# Patient Record
Sex: Female | Born: 1980 | Race: Black or African American | Hispanic: No | Marital: Single | Smoking: Former smoker
Health system: Southern US, Community
[De-identification: ages and names within clinical notes are randomized; demographics above are authoritative.]

## PROBLEM LIST (undated history)

## (undated) DIAGNOSIS — K5190 Ulcerative colitis, unspecified, without complications: Secondary | ICD-10-CM

## (undated) DIAGNOSIS — D509 Iron deficiency anemia, unspecified: Secondary | ICD-10-CM

## (undated) DIAGNOSIS — K529 Noninfective gastroenteritis and colitis, unspecified: Secondary | ICD-10-CM

## (undated) DIAGNOSIS — Z98891 History of uterine scar from previous surgery: Secondary | ICD-10-CM

## (undated) DIAGNOSIS — R634 Abnormal weight loss: Secondary | ICD-10-CM

## (undated) DIAGNOSIS — Z789 Other specified health status: Secondary | ICD-10-CM

## (undated) DIAGNOSIS — R1030 Lower abdominal pain, unspecified: Secondary | ICD-10-CM

## (undated) HISTORY — DX: Ulcerative colitis, unspecified, without complications: K51.90

## (undated) HISTORY — DX: Noninfective gastroenteritis and colitis, unspecified: K52.9

## (undated) HISTORY — DX: History of uterine scar from previous surgery: Z98.891

## (undated) HISTORY — DX: Lower abdominal pain, unspecified: R10.30

## (undated) HISTORY — DX: Abnormal weight loss: R63.4

## (undated) HISTORY — DX: Iron deficiency anemia, unspecified: D50.9

---

## 2015-01-25 DIAGNOSIS — Z98891 History of uterine scar from previous surgery: Secondary | ICD-10-CM

## 2015-01-25 HISTORY — DX: History of uterine scar from previous surgery: Z98.891

## 2015-06-08 LAB — OB RESULTS CONSOLE VARICELLA ZOSTER ANTIBODY, IGG: VARICELLA IGG: IMMUNE

## 2015-06-08 LAB — OB RESULTS CONSOLE HIV ANTIBODY (ROUTINE TESTING)
HIV: NONREACTIVE
HIV: NONREACTIVE

## 2015-06-08 LAB — OB RESULTS CONSOLE RPR
RPR: NONREACTIVE
RPR: NONREACTIVE

## 2015-06-08 LAB — OB RESULTS CONSOLE HEPATITIS B SURFACE ANTIGEN: Hepatitis B Surface Ag: NEGATIVE

## 2015-06-08 LAB — OB RESULTS CONSOLE ABO/RH: RH TYPE: POSITIVE

## 2015-06-08 LAB — OB RESULTS CONSOLE RUBELLA ANTIBODY, IGM: RUBELLA: NON-IMMUNE/NOT IMMUNE

## 2015-10-15 LAB — OB RESULTS CONSOLE RPR
RPR: NONREACTIVE
RPR: NONREACTIVE
RPR: NONREACTIVE

## 2015-10-15 LAB — OB RESULTS CONSOLE HIV ANTIBODY (ROUTINE TESTING)
HIV: NONREACTIVE
HIV: NONREACTIVE
HIV: NONREACTIVE

## 2015-12-10 LAB — OB RESULTS CONSOLE GBS: GBS: NEGATIVE

## 2016-01-06 ENCOUNTER — Encounter: Payer: Self-pay | Admitting: *Deleted

## 2016-01-06 ENCOUNTER — Inpatient Hospital Stay
Admission: EM | Admit: 2016-01-06 | Discharge: 2016-01-08 | DRG: 765 | Disposition: A | Discharge status: Home / Self Care | Payer: Medicaid Other | Attending: Obstetrics and Gynecology | Admitting: Obstetrics and Gynecology

## 2016-01-06 ENCOUNTER — Inpatient Hospital Stay: Payer: Medicaid Other | Admitting: Certified Registered"

## 2016-01-06 ENCOUNTER — Inpatient Hospital Stay: Payer: Medicaid Other

## 2016-01-06 ENCOUNTER — Encounter
Admission: EM | Disposition: A | Discharge status: Home / Self Care | Payer: Self-pay | Source: Home / Self Care | Attending: Obstetrics and Gynecology

## 2016-01-06 DIAGNOSIS — Z3A40 40 weeks gestation of pregnancy: Secondary | ICD-10-CM | POA: Diagnosis not present

## 2016-01-06 DIAGNOSIS — Z98891 History of uterine scar from previous surgery: Secondary | ICD-10-CM

## 2016-01-06 DIAGNOSIS — Y703 Surgical instruments, materials and anesthesiology devices (including sutures) associated with adverse incidents: Secondary | ICD-10-CM

## 2016-01-06 DIAGNOSIS — D62 Acute posthemorrhagic anemia: Secondary | ICD-10-CM | POA: Diagnosis not present

## 2016-01-06 DIAGNOSIS — O9081 Anemia of the puerperium: Secondary | ICD-10-CM | POA: Diagnosis not present

## 2016-01-06 LAB — CBC
HCT: 30.8 % — ABNORMAL LOW (ref 35.0–47.0)
HEMATOCRIT: 34.3 % — AB (ref 35.0–47.0)
Hemoglobin: 11.5 g/dL — ABNORMAL LOW (ref 12.0–16.0)
Hemoglobin: 10.1 g/dL — ABNORMAL LOW (ref 12.0–16.0)
MCH: 28 pg (ref 26.0–34.0)
MCH: 27.6 pg (ref 26.0–34.0)
MCHC: 33.6 g/dL (ref 32.0–36.0)
MCHC: 32.9 g/dL (ref 32.0–36.0)
MCV: 83.2 fL (ref 80.0–100.0)
MCV: 83.7 fL (ref 80.0–100.0)
Platelets: 179 10*3/uL (ref 150–440)
Platelets: 169 K/uL (ref 150–440)
RBC: 4.13 MIL/uL (ref 3.80–5.20)
RBC: 3.68 MIL/uL — ABNORMAL LOW (ref 3.80–5.20)
RDW: 14.7 % — AB (ref 11.5–14.5)
RDW: 14.8 % — ABNORMAL HIGH (ref 11.5–14.5)
WBC: 10 10*3/uL (ref 3.6–11.0)
WBC: 16.5 K/uL — ABNORMAL HIGH (ref 3.6–11.0)

## 2016-01-06 LAB — TYPE AND SCREEN
ABO/RH(D): O POS
Antibody Screen: POSITIVE

## 2016-01-06 SURGERY — Surgical Case
Anesthesia: General | Wound class: Clean Contaminated

## 2016-01-06 MED ORDER — SOD CITRATE-CITRIC ACID 500-334 MG/5ML PO SOLN
ORAL | Status: AC
  Filled 2016-01-06: qty 15

## 2016-01-06 MED ORDER — OXYTOCIN 40 UNITS IN LACTATED RINGERS INFUSION - SIMPLE MED
INTRAVENOUS | Status: DC | PRN
  Administered 2016-01-06: 1000 mL via INTRAVENOUS

## 2016-01-06 MED ORDER — SENNOSIDES-DOCUSATE SODIUM 8.6-50 MG PO TABS
2.0000 | ORAL_TABLET | ORAL | Status: DC
  Administered 2016-01-06 – 2016-01-07 (×2): 2 via ORAL
  Filled 2016-01-06 (×2): qty 2

## 2016-01-06 MED ORDER — SIMETHICONE 80 MG PO CHEW
80.0000 mg | CHEWABLE_TABLET | ORAL | Status: DC
  Administered 2016-01-06 – 2016-01-07 (×2): 80 mg via ORAL
  Filled 2016-01-06 (×2): qty 1

## 2016-01-06 MED ORDER — ONDANSETRON HCL 4 MG/2ML IJ SOLN
INTRAMUSCULAR | Status: DC | PRN
  Administered 2016-01-06: 4 mg via INTRAVENOUS

## 2016-01-06 MED ORDER — SUCCINYLCHOLINE CHLORIDE 20 MG/ML IJ SOLN
INTRAMUSCULAR | Status: DC | PRN
Start: 2016-01-06 — End: 2016-01-06
  Administered 2016-01-06: 120 mg via INTRAVENOUS

## 2016-01-06 MED ORDER — ACETAMINOPHEN 325 MG PO TABS: 650.0000 mg | ORAL_TABLET | ORAL | Status: DC | PRN

## 2016-01-06 MED ORDER — OXYCODONE-ACETAMINOPHEN 5-325 MG PO TABS
2.0000 | ORAL_TABLET | ORAL | Status: DC | PRN
  Filled 2016-01-06: qty 2

## 2016-01-06 MED ORDER — BUPIVACAINE 0.25 % ON-Q PUMP DUAL CATH 400 ML
400.0000 mL | INJECTION | Status: DC
  Filled 2016-01-06: qty 400

## 2016-01-06 MED ORDER — BUPIVACAINE HCL 0.5 % IJ SOLN
50.0000 mL | Freq: Once | INTRAMUSCULAR | Status: DC
  Filled 2016-01-06: qty 50

## 2016-01-06 MED ORDER — COCONUT OIL OIL
1.0000 | TOPICAL_OIL | Status: DC | PRN
Start: 2016-01-06 — End: 2016-01-08
  Administered 2016-01-08: 1 via TOPICAL
  Filled 2016-01-06: qty 120

## 2016-01-06 MED ORDER — CEFAZOLIN IN D5W 1 GM/50ML IV SOLN
INTRAVENOUS | Status: AC
  Filled 2016-01-06: qty 100

## 2016-01-06 MED ORDER — SIMETHICONE 80 MG PO CHEW: 80.0000 mg | CHEWABLE_TABLET | ORAL | Status: DC | PRN

## 2016-01-06 MED ORDER — WITCH HAZEL-GLYCERIN EX PADS: 1.0000 "application " | MEDICATED_PAD | CUTANEOUS | Status: DC | PRN

## 2016-01-06 MED ORDER — MENTHOL 3 MG MT LOZG
1.0000 | LOZENGE | OROMUCOSAL | Status: DC | PRN
  Filled 2016-01-06: qty 9

## 2016-01-06 MED ORDER — ACETAMINOPHEN 325 MG PO TABS
650.0000 mg | ORAL_TABLET | Freq: Once | ORAL | Status: AC
  Administered 2016-01-06: 650 mg via ORAL

## 2016-01-06 MED ORDER — PROPOFOL 10 MG/ML IV BOLUS
INTRAVENOUS | Status: DC | PRN
  Administered 2016-01-06: 200 mg via INTRAVENOUS

## 2016-01-06 MED ORDER — TETANUS-DIPHTH-ACELL PERTUSSIS 5-2.5-18.5 LF-MCG/0.5 IM SUSP: 0.5000 mL | Freq: Once | INTRAMUSCULAR | Status: DC

## 2016-01-06 MED ORDER — OXYCODONE HCL 5 MG PO TABS: 5.0000 mg | ORAL_TABLET | Freq: Once | ORAL | Status: DC | PRN

## 2016-01-06 MED ORDER — DIPHENHYDRAMINE HCL 25 MG PO CAPS: 25.0000 mg | ORAL_CAPSULE | Freq: Four times a day (QID) | ORAL | Status: DC | PRN

## 2016-01-06 MED ORDER — DIBUCAINE 1 % RE OINT: 1.0000 "application " | TOPICAL_OINTMENT | RECTAL | Status: DC | PRN

## 2016-01-06 MED ORDER — KETOROLAC TROMETHAMINE 30 MG/ML IJ SOLN
30.0000 mg | Freq: Once | INTRAMUSCULAR | Status: AC
  Administered 2016-01-06: 30 mg via INTRAVENOUS

## 2016-01-06 MED ORDER — BUPIVACAINE HCL (PF) 0.5 % IJ SOLN
INTRAMUSCULAR | Status: AC
Start: 2016-01-06 — End: 2016-01-06
  Filled 2016-01-06: qty 30

## 2016-01-06 MED ORDER — HYDROMORPHONE HCL 1 MG/ML IJ SOLN: 0.2500 mg | INTRAMUSCULAR | Status: DC | PRN

## 2016-01-06 MED ORDER — CEFAZOLIN SODIUM-DEXTROSE 2-4 GM/100ML-% IV SOLN
2.0000 g | INTRAVENOUS | Status: AC
  Administered 2016-01-06: 2 g via INTRAVENOUS
  Filled 2016-01-06: qty 100

## 2016-01-06 MED ORDER — LACTATED RINGERS IV SOLN
INTRAVENOUS | Status: DC | PRN
  Administered 2016-01-06: 11:00:00 via INTRAVENOUS

## 2016-01-06 MED ORDER — SOD CITRATE-CITRIC ACID 500-334 MG/5ML PO SOLN: 30.0000 mL | ORAL | Status: DC

## 2016-01-06 MED ORDER — EPHEDRINE SULFATE 50 MG/ML IJ SOLN
INTRAMUSCULAR | Status: DC | PRN
Start: 2016-01-06 — End: 2016-01-06
  Administered 2016-01-06: 10 mg via INTRAVENOUS

## 2016-01-06 MED ORDER — OXYCODONE-ACETAMINOPHEN 5-325 MG PO TABS
1.0000 | ORAL_TABLET | ORAL | Status: DC | PRN
  Administered 2016-01-06 – 2016-01-07 (×4): 1 via ORAL
  Filled 2016-01-06 (×3): qty 1

## 2016-01-06 MED ORDER — OXYTOCIN 40 UNITS IN LACTATED RINGERS INFUSION - SIMPLE MED
INTRAVENOUS | Status: AC
  Filled 2016-01-06: qty 1000

## 2016-01-06 MED ORDER — MEPERIDINE HCL 25 MG/ML IJ SOLN: 6.2500 mg | INTRAMUSCULAR | Status: DC | PRN

## 2016-01-06 MED ORDER — PROMETHAZINE HCL 25 MG/ML IJ SOLN: 6.2500 mg | INTRAMUSCULAR | Status: DC | PRN

## 2016-01-06 MED ORDER — OXYCODONE HCL 5 MG/5ML PO SOLN: 5.0000 mg | Freq: Once | ORAL | Status: DC | PRN

## 2016-01-06 MED ORDER — DEXAMETHASONE SODIUM PHOSPHATE 10 MG/ML IJ SOLN
INTRAMUSCULAR | Status: DC | PRN
  Administered 2016-01-06: 10 mg via INTRAVENOUS

## 2016-01-06 MED ORDER — BUPIVACAINE HCL (PF) 0.5 % IJ SOLN
INTRAMUSCULAR | Status: DC | PRN
  Administered 2016-01-06: 10 mL

## 2016-01-06 MED ORDER — MIDAZOLAM HCL 2 MG/2ML IJ SOLN
INTRAMUSCULAR | Status: DC | PRN
  Administered 2016-01-06: 2 mg via INTRAVENOUS

## 2016-01-06 MED ORDER — LACTATED RINGERS IV SOLN: INTRAVENOUS | Status: DC

## 2016-01-06 MED ORDER — MORPHINE SULFATE (PF) 4 MG/ML IV SOLN: 4.0000 mg | INTRAVENOUS | Status: DC | PRN

## 2016-01-06 MED ORDER — FENTANYL CITRATE (PF) 100 MCG/2ML IJ SOLN: 25.0000 ug | INTRAMUSCULAR | Status: DC | PRN

## 2016-01-06 MED ORDER — IBUPROFEN 600 MG PO TABS
600.0000 mg | ORAL_TABLET | Freq: Four times a day (QID) | ORAL | Status: DC
  Administered 2016-01-06 – 2016-01-08 (×8): 600 mg via ORAL
  Filled 2016-01-06 (×7): qty 1

## 2016-01-06 MED ORDER — PRENATAL MULTIVITAMIN CH
1.0000 | ORAL_TABLET | Freq: Every day | ORAL | Status: DC
  Administered 2016-01-07 – 2016-01-08 (×2): 1 via ORAL
  Filled 2016-01-06 (×2): qty 1

## 2016-01-06 MED ORDER — OXYTOCIN 40 UNITS IN LACTATED RINGERS INFUSION - SIMPLE MED
2.5000 [IU]/h | INTRAVENOUS | Status: AC
  Administered 2016-01-06: 2.5 [IU]/h via INTRAVENOUS
  Filled 2016-01-06: qty 1000

## 2016-01-06 MED ORDER — FENTANYL CITRATE (PF) 100 MCG/2ML IJ SOLN
INTRAMUSCULAR | Status: DC | PRN
  Administered 2016-01-06 (×4): 50 ug via INTRAVENOUS

## 2016-01-06 MED ORDER — SIMETHICONE 80 MG PO CHEW
80.0000 mg | CHEWABLE_TABLET | Freq: Three times a day (TID) | ORAL | Status: DC
  Administered 2016-01-06 – 2016-01-08 (×6): 80 mg via ORAL
  Filled 2016-01-06 (×6): qty 1

## 2016-01-06 SURGICAL SUPPLY — 28 items
BAG COUNTER SPONGE EZ (MISCELLANEOUS) ×2 IMPLANT
CANISTER SUCT 3000ML (MISCELLANEOUS) ×3 IMPLANT
CATH KIT ON-Q SILVERSOAK 5IN (CATHETERS) ×6 IMPLANT
CHLORAPREP W/TINT 26ML (MISCELLANEOUS) ×6 IMPLANT
CLOSURE WOUND 1/2 X4 (GAUZE/BANDAGES/DRESSINGS) ×1
COUNTER SPONGE BAG EZ (MISCELLANEOUS) ×1
DRSG OPSITE POSTOP 4X10 (GAUZE/BANDAGES/DRESSINGS) ×3 IMPLANT
DRSG TELFA 3X8 NADH (GAUZE/BANDAGES/DRESSINGS) ×3 IMPLANT
ELECT CAUTERY BLADE 6.4 (BLADE) ×3 IMPLANT
ELECT REM PT RETURN 9FT ADLT (ELECTROSURGICAL) ×3
ELECTRODE REM PT RTRN 9FT ADLT (ELECTROSURGICAL) ×1 IMPLANT
GAUZE SPONGE 4X4 12PLY STRL (GAUZE/BANDAGES/DRESSINGS) ×3 IMPLANT
GLOVE BIO SURGEON STRL SZ7 (GLOVE) ×3 IMPLANT
GLOVE INDICATOR 7.5 STRL GRN (GLOVE) ×3 IMPLANT
GOWN STRL REUS W/ TWL LRG LVL3 (GOWN DISPOSABLE) ×3 IMPLANT
GOWN STRL REUS W/TWL LRG LVL3 (GOWN DISPOSABLE) ×6
LIQUID BAND (GAUZE/BANDAGES/DRESSINGS) ×3 IMPLANT
NS IRRIG 1000ML POUR BTL (IV SOLUTION) ×3 IMPLANT
PACK C SECTION AR (MISCELLANEOUS) ×3 IMPLANT
PAD OB MATERNITY 4.3X12.25 (PERSONAL CARE ITEMS) ×3 IMPLANT
PAD PREP 24X41 OB/GYN DISP (PERSONAL CARE ITEMS) ×3 IMPLANT
STAPLER INSORB 30 2030 C-SECTI (MISCELLANEOUS) ×3 IMPLANT
STRIP CLOSURE SKIN 1/2X4 (GAUZE/BANDAGES/DRESSINGS) ×2 IMPLANT
SUT MNCRL AB 4-0 PS2 18 (SUTURE) ×3 IMPLANT
SUT PDS AB 1 TP1 96 (SUTURE) ×6 IMPLANT
SUT VIC AB 0 CTX 36 (SUTURE) ×4
SUT VIC AB 0 CTX36XBRD ANBCTRL (SUTURE) ×2 IMPLANT
SUT VIC AB 2-0 CT1 36 (SUTURE) ×3 IMPLANT

## 2016-01-06 NOTE — OB Triage Note (Signed)
Pt sent over from office for fetal bradycardia. Pt arrived at 1027 fetal heart rate was 120 but then decelerated to 70s. Stat C/S called at 1030.

## 2016-01-06 NOTE — Transfer of Care (Signed)
Immediate Anesthesia Transfer of Care Note  Patient: Christina Obrien  Procedure(s) Performed: Procedure(s): CESAREAN SECTION (N/A)  Patient Location: PACU  Anesthesia Type:General  Level of Consciousness: awake, alert , oriented and patient cooperative  Airway & Oxygen Therapy: Patient Spontanous Breathing and Patient connected to face mask oxygen  Post-op Assessment: Report given to RN, Post -op Vital signs reviewed and stable and Patient moving all extremities X 4  Post vital signs: Reviewed and stable  Last Vitals:  Vitals:   01/06/16 1156  BP: 133/78  Pulse: 88  Resp: 13  Temp: 36.3 C    Last Pain: There were no vitals filed for this visit.       Complications: No apparent anesthesia complications

## 2016-01-06 NOTE — Anesthesia Procedure Notes (Signed)
Procedure Name: Intubation Date/Time: 01/06/2016 10:42 AM Performed by: Silvana Newness Pre-anesthesia Checklist: Patient identified, Emergency Drugs available, Suction available, Patient being monitored and Timeout performed Patient Re-evaluated:Patient Re-evaluated prior to inductionOxygen Delivery Method: Circle system utilized Preoxygenation: Pre-oxygenation with 100% oxygen Intubation Type: IV induction, Rapid sequence and Cricoid Pressure applied Laryngoscope Size: Glidescope and 3 Grade View: Grade I Tube type: Oral Tube size: 7.0 mm Number of attempts: 1 Airway Equipment and Method: Rigid stylet Placement Confirmation: ETT inserted through vocal cords under direct vision,  positive ETCO2 and breath sounds checked- equal and bilateral Secured at: 20 cm Tube secured with: Tape Dental Injury: Teeth and Oropharynx as per pre-operative assessment

## 2016-01-06 NOTE — Anesthesia Postprocedure Evaluation (Signed)
Anesthesia Post Note  Patient: Christina Obrien  Procedure(s) Performed: Procedure(s) (LRB): CESAREAN SECTION (N/A)  Patient location during evaluation: PACU Anesthesia Type: General Level of consciousness: awake and alert and oriented Pain management: pain level controlled Vital Signs Assessment: post-procedure vital signs reviewed and stable Respiratory status: spontaneous breathing, nonlabored ventilation and respiratory function stable Cardiovascular status: blood pressure returned to baseline and stable Postop Assessment: no signs of nausea or vomiting Anesthetic complications: no    Last Vitals:  Vitals:   01/06/16 1215 01/06/16 1253  BP: 120/78 120/66  Pulse: 80 91  Resp: 16   Temp: 37.1 C 36.7 C    Last Pain:  Vitals:   01/06/16 1253  TempSrc: Oral  PainSc:                  Ana Woodroof

## 2016-01-06 NOTE — Anesthesia Preprocedure Evaluation (Signed)
Anesthesia Evaluation  Patient identified by MRN, date of birth, ID band Patient awake    Reviewed: Allergy & Precautions, NPO status , Patient's Chart, lab work & pertinent test results  History of Anesthesia Complications Negative for: history of anesthetic complications  Airway Mallampati: II  TM Distance: >3 FB Neck ROM: Full    Dental no notable dental hx.    Pulmonary neg pulmonary ROS, neg sleep apnea, neg COPD,    breath sounds clear to auscultation- rhonchi (-) wheezing      Cardiovascular Exercise Tolerance: Good (-) hypertension(-) CAD and (-) Past MI  Rhythm:Regular Rate:Normal - Systolic murmurs and - Diastolic murmurs    Neuro/Psych negative neurological ROS  negative psych ROS   GI/Hepatic negative GI ROS, Neg liver ROS,   Endo/Other  negative endocrine ROSneg diabetes  Renal/GU negative Renal ROS     Musculoskeletal negative musculoskeletal ROS (+)   Abdominal Gravid abdomen  Peds  Hematology negative hematology ROS (+)   Anesthesia Other Findings   Reproductive/Obstetrics (+) Pregnancy                             Anesthesia Physical Anesthesia Plan  ASA: II and emergent  Anesthesia Plan: General   Post-op Pain Management:    Induction: Intravenous, Rapid sequence and Cricoid pressure planned  Airway Management Planned: Oral ETT  Additional Equipment:   Intra-op Plan:   Post-operative Plan: Extubation in OR  Informed Consent: I have reviewed the patients History and Physical, chart, labs and discussed the procedure including the risks, benefits and alternatives for the proposed anesthesia with the patient or authorized representative who has indicated his/her understanding and acceptance.   Dental advisory given  Plan Discussed with: CRNA and Anesthesiologist  Anesthesia Plan Comments: (Emergent CS for fetal bradycardia)        Anesthesia Quick  Evaluation

## 2016-01-06 NOTE — Op Note (Signed)
Preoperative Diagnosis: 1) 35 y.o. [id number] at 30w3d2) Fetal distress - category III tracing 3) AMA  Postoperative Diagnosis: 1) 35y.o. [id number]) AMA  Operation Performed: Primary low transverse C-section via pfannenstiel skin incision  Indication: Emergent C-section for fetal distress  Anesthesia: General  Primary Surgeon: AMalachy Mood MD  Preoperative Antibiotics: 2g ancef  Estimated Blood Loss: 5042m IV Fluids: 130069mUrine Output:: 125m39mrains or Tubes: Foley to gravity drainage, ON-Q catheter system  Implants: none  Specimens Removed: none  Complications: none  Intraoperative Findings:  Normal tubes ovaries and uterus.  Delivery resulted in the birth of a liveborn female, APGAR (1 MIN): 7   APGAR (5 MINS): 9, weight pending  Patient Condition: stable  Procedure in Detail:  Patient was taken to the operating room were she was administered regional anesthesia.  She was positioned in the supine position, prepped and draped in the  Usual sterile fashion.  General anesthesia was induced.  Utilizing the scalpel a pfannenstiel skin incision was made 2cm above the pubic symphysis and carried down sharply to the the level of the rectus fascia.  The fascia was incised in the midline using the scalpel and then extended using manual traction.  The midline was identified, the peritoneum was entered bluntly and expanded using manual tractions.  The uterus was noted to be in a none rotated position.  Next the bladder blade was placed retracting the bladder caudally.  A bladder flap was not created.  A low transverse incision was scored on the lower uterine segment.  The hysterotomy was entered bluntly using the operators finger.  The hysterotomy incision was extended using manual traction.  The operators hand was placed within the hysterotomy position noting the fetus to be within the OA position.  The vertex was grasped, flexed, brought to the incision, and delivered a  traumatically using fundal pressure.  The remainder of the body delivered with ease.  Thick meconium was encountered, there was little fluid noted in the uterus.  Cord was clamped and cut before handing off to the awaiting neonatologist.  The placenta was delivered using manual extraction.  There was little to no blood in the cord preventing cord blood or cord pH collection.   The uterus was exteriorized, wiped clean of clots and debris using two moist laps.  The hysterotomy was closed using a two layer closure of 0 Vicryl, with the first being a running locked, the second a vertical imbricating.  The uterus was returned to the abdomen.  The peritoneal gutters were wiped clean of clots and debris using two moist laps.  The hysterotomy incision was re-inspected noted to be hemostatic. The rectus muscles were inspected noted to be hemostatic.  The superior border of the rectus fascia was grasped with a Kocher clamp.  The ON-Q trocars were then placed 4cm above the superior border of the incision and tunneled subfascially.  The introducers were removed and the catheters were threaded through the sleeves after which the sleeves were removed.  The fascia was closed using a looped #1 PDS in a running fashion taking 1cm by 1cm bites.  The subcutaneous tissue was irrigated using warm saline, hemostasis achieved using the bovie.  The subcutaneous dead space was less than 3cm and was not closed.  The skin was closed using insorb staples.  Sponge needle and instrument counts were corrects times two.  The patient tolerated the procedure well and was taken to the recovery room in stable condition.

## 2016-01-06 NOTE — H&P (Signed)
Obstetric H&P   Chief Complaint: Prolonged decel in office  Prenatal Care Provider: [facility]  History of Present Illness: 35 y.o. [id number] at 30w3dpresenting for routine antenatal testing for AMA who on office NST was initially non-reactive then had a porlonged decel to the 70;s without return to baseline.  She was driven to the hospital by office staff, was initially down on monitoring here as well but appeared to recover before heartones returned to the 70's.  Stat C-section was called while I was in route to the hospital.  I was in conversation with L&D staff at 10:26 from the office.    O pos / ABSC neg / RNI / VZI / RPR NR / HIV neg / HBsAg neg / GBS negative  Review of Systems: 10 point review of systems negative unless otherwise noted in HPI  Past Medical History: No past medical history on file.  Past Surgical History: No past surgical history on file.  Family History: No family history on file.  Social History: Social History   Social History  . Marital status: Single    Spouse name: N/A  . Number of children: N/A  . Years of education: N/A   Occupational History  . Not on file.   Social History Main Topics  . Smoking status: Not on file  . Smokeless tobacco: Not on file  . Alcohol use Not on file  . Drug use: Unknown  . Sexual activity: Not on file   Other Topics Concern  . Not on file   Social History Narrative  . No narrative on file    Medications: Prior to Admission medications   Not on File    Allergies: Allergies not on file  Physical Exam: Vitals: There were no vitals taken for this visit. BP in clinic 102/62  Urine Dip Protein: N/A FHT 150 with one decel to 90, stayed in 150's with for about 2 minutes before returning to the 70's.  Off monitor and to OR 10:35  FHT: Patient on monitor 10:28  Toco: none  General: NAD HEENT: normocephalic Pulmonary: no increased work of breathing Abdomen: Gravid, non-tender Genitourinary: not  checked Extremities: no edema  Labs: Results for orders placed or performed during the hospital encounter of 01/06/16 (from the past 24 hour(s))  Type and screen [place]    Status: None (Preliminary result)   Collection Time: 01/06/16 10:34 AM  Result Value Ref Range   ABO/RH(D) PENDING    Antibody Screen PENDING    Sample Expiration 01/09/2016   CBC     Status: Abnormal   Collection Time: 01/06/16 10:34 AM  Result Value Ref Range   WBC 10.0 3.6 - 11.0 K/uL   RBC 4.13 3.80 - 5.20 MIL/uL   Hemoglobin 11.5 (L) 12.0 - 16.0 g/dL   HCT 34.3 (L) 35.0 - 47.0 %   MCV 83.2 80.0 - 100.0 fL   MCH 28.0 26.0 - 34.0 pg   MCHC 33.6 32.0 - 36.0 g/dL   RDW 14.7 (H) 11.5 - 14.5 %   Platelets 179 150 - 440 K/uL    Assessment: 35y.o. G1P0 presenting for prolonged fetal decel with emergent C-section  Plan: 1) Went to OR for emergent C-section shortly after arrival  2) Fetus - cat III tracing  3) PNL - O pos / ABSC neg / RNI / VZI / RPR NR / HIV neg / HBsAg neg / GBS negative  4) TDAP - declined  5) Disposition - pending  postoperative recovery

## 2016-01-07 LAB — CBC
HCT: 25.5 % — ABNORMAL LOW (ref 35.0–47.0)
Hemoglobin: 8.3 g/dL — ABNORMAL LOW (ref 12.0–16.0)
MCH: 27.4 pg (ref 26.0–34.0)
MCHC: 32.6 g/dL (ref 32.0–36.0)
MCV: 84 fL (ref 80.0–100.0)
PLATELETS: 154 10*3/uL (ref 150–440)
RBC: 3.03 MIL/uL — AB (ref 3.80–5.20)
RDW: 14.8 % — AB (ref 11.5–14.5)
WBC: 15.3 10*3/uL — AB (ref 3.6–11.0)

## 2016-01-07 LAB — SURGICAL PATHOLOGY

## 2016-01-07 LAB — RPR: RPR: NONREACTIVE

## 2016-01-07 MED ORDER — MEASLES, MUMPS & RUBELLA VAC ~~LOC~~ INJ
0.5000 mL | INJECTION | Freq: Once | SUBCUTANEOUS | Status: DC
  Filled 2016-01-07: qty 0.5

## 2016-01-07 MED ORDER — FERROUS SULFATE 325 (65 FE) MG PO TABS
325.0000 mg | ORAL_TABLET | Freq: Two times a day (BID) | ORAL | Status: DC
  Administered 2016-01-07 – 2016-01-08 (×2): 325 mg via ORAL
  Filled 2016-01-07 (×2): qty 1

## 2016-01-07 NOTE — Progress Notes (Signed)
  Post-operative Day 1  Subjective: no complaints and tolerating PO  Plans to get OOB soon  Objective: Blood pressure 108/60, pulse 87, temperature 98.9 F (37.2 C), temperature source Oral, resp. rate 18, height 5' 4"  (1.626 m), weight 183 lb (83 kg), last menstrual period 03/29/2015, SpO2 100 %.  Physical Exam:  General: alert and cooperative Lochia: appropriate Uterine Fundus: firm Incision: healing well, no significant drainage DVT Evaluation: No evidence of DVT seen on physical exam. SCDs on right now Abdomen: soft, NT   Recent Labs  01/06/16 1316 01/07/16 0511  HGB 10.1* 8.3*  HCT 30.8* 25.5*    Assessment POD #1, CS for fetal distress, acute blood loss anemia  Plan: Continue PO care, Advance activity as tolerated, Fe replacement, anemia precautions and Discharge POD 2 or 3  Feeding: breast Contraception: IUD Blood Type: O+ RNI/VI TDAP declined    Burlene Arnt, [place] 01/07/2016, 11:39 AM

## 2016-01-08 MED ORDER — FERROUS SULFATE 325 (65 FE) MG PO TABS: 325.0000 mg | ORAL_TABLET | Freq: Every day | ORAL | 3 refills | Status: DC

## 2016-01-08 MED ORDER — IBUPROFEN 600 MG PO TABS: 600.0000 mg | ORAL_TABLET | Freq: Four times a day (QID) | ORAL | 0 refills | Status: DC

## 2016-01-08 MED ORDER — MEASLES, MUMPS & RUBELLA VAC ~~LOC~~ INJ
0.5000 mL | INJECTION | Freq: Once | SUBCUTANEOUS | Status: AC
  Administered 2016-01-08: 0.5 mL via SUBCUTANEOUS
  Filled 2016-01-08 (×2): qty 0.5

## 2016-01-08 MED ORDER — OXYCODONE-ACETAMINOPHEN 5-325 MG PO TABS: 1.0000 | ORAL_TABLET | ORAL | 0 refills | Status: DC | PRN

## 2016-01-08 NOTE — Progress Notes (Signed)
Patient discharged home with infant and significant other. Discharge instructions, prescriptions and follow up appointment given to and reviewed with patient and significant other. Patient verbalized understanding. Escorted out via wheelchair by [employer].

## 2016-01-08 NOTE — Discharge Summary (Signed)
Obstetric Discharge Summary Reason for Admission: cesarean section secondary to non-reassuring fetal heartones Prenatal Procedures: none Intrapartum Procedures: cesarean: low cervical, transverse Postpartum Procedures: none Complications-Operative and Postpartum: none Hemoglobin  Date Value Ref Range Status  01/07/2016 8.3 (L) 12.0 - 16.0 g/dL Final   HCT  Date Value Ref Range Status  01/07/2016 25.5 (L) 35.0 - 47.0 % Final    Physical Exam:  General: alert, appears stated age and no distress Lochia: appropriate Uterine Fundus: firm Incision: healing well DVT Evaluation: No evidence of DVT seen on physical exam.  Discharge Diagnoses: Term Pregnancy-delivered  Discharge Information: Date: 01/08/2016 Activity: pelvic rest Diet: routine Allergies as of 01/08/2016   No Known Allergies     Medication List    TAKE these medications   ferrous sulfate 325 (65 FE) MG tablet Take 1 tablet (325 mg total) by mouth daily with breakfast.   ibuprofen 600 MG tablet Commonly known as:  ADVIL,MOTRIN Take 1 tablet (600 mg total) by mouth every 6 (six) hours.   multivitamin-prenatal 27-0.8 MG Tabs tablet Take 1 tablet by mouth daily at 12 noon.   oxyCODONE-acetaminophen 5-325 MG tablet Commonly known as:  PERCOCET/ROXICET Take 1-2 tablets by mouth every 4 (four) hours as needed (pain scale > 7).      Condition: stable Discharge to: home Follow-up Information    Flavio Lindroth, Stoney Bang, MD Follow up in 1 week(s).   Specialty:  Obstetrics and Gynecology Why:  incision check Contact information: [address] [place] [address] [phone]           Newborn Data: Live born female  Birth Weight: 6 lb 9.1 oz (2980 g) APGAR: 7, 9  Home with mother.  Tanzie Rothschild, [place] 01/08/2016, 10:23 AM

## 2016-12-13 ENCOUNTER — Encounter: Payer: Self-pay | Admitting: Certified Nurse Midwife

## 2016-12-13 ENCOUNTER — Ambulatory Visit (INDEPENDENT_AMBULATORY_CARE_PROVIDER_SITE_OTHER): Payer: 59 | Admitting: Certified Nurse Midwife

## 2016-12-13 VITALS — BP 92/58 | HR 71 | Ht 65.0 in | Wt 124.0 lb

## 2016-12-13 DIAGNOSIS — K529 Noninfective gastroenteritis and colitis, unspecified: Secondary | ICD-10-CM

## 2016-12-13 DIAGNOSIS — R1030 Lower abdominal pain, unspecified: Secondary | ICD-10-CM

## 2016-12-13 DIAGNOSIS — R634 Abnormal weight loss: Secondary | ICD-10-CM

## 2016-12-13 HISTORY — DX: Noninfective gastroenteritis and colitis, unspecified: K52.9

## 2016-12-13 HISTORY — DX: Lower abdominal pain, unspecified: R10.30

## 2016-12-13 HISTORY — DX: Abnormal weight loss: R63.4

## 2016-12-13 LAB — POCT URINE PREGNANCY: Preg Test, Ur: NEGATIVE

## 2016-12-13 LAB — POCT URINALYSIS DIPSTICK
Bilirubin, UA: NEGATIVE
Glucose, UA: NEGATIVE
Leukocytes, UA: NEGATIVE
Nitrite, UA: NEGATIVE
SPEC GRAV UA: 1.025 (ref 1.010–1.025)
Urobilinogen, UA: 0.2 E.U./dL
pH, UA: 6 (ref 5.0–8.0)

## 2016-12-13 NOTE — Progress Notes (Signed)
Obstetrics & Gynecology Office Visit   Chief Complaint:  Chief Complaint  Patient presents with  . Abdominal Pain    pt c/o n/v and diarrhea; weight loss    History of Present Illness: Abdominal Pain: Christina Obrien is a 36 year old G4 P2022, LMP 16 Nov 2016 who presents with complaints of abdominal pain. The pain is described as aching and dull, and is 7/10 in intensity. Pain is located in the bilateral lower abdomen without radiation. Onset was 2 weeks ago. Symptoms have been unchanged since. Aggravating factors: urge to have bowel movement.  Alleviating factors: having bowel movements, Immodium,  and drinking warm tea. Associated symptoms: postprandial diarrhea (3 or more times a day), intermittent nausea, and weight loss of 40# over the last 2 months.  The patient denies dysuria, urinary frequency, fever, hematuria, or melena. Her frequent stools have also caused a flare up of her hemorrhoids. Past abdominal surgery: Cesarean section 2017 Current contraception: condoms, sometimes   Review of Systems:  Review of Systems  Constitutional: Positive for chills, malaise/fatigue and weight loss. Negative for fever.  Respiratory: Negative for cough.   Cardiovascular: Negative for chest pain.  Gastrointestinal: Positive for abdominal pain, diarrhea (postprandial) and nausea. Negative for melena and vomiting.       Also positive for stool urgency and hemorrhoidal discomfort  Genitourinary: Negative for dysuria, frequency and hematuria.       Denies irregular bleeding, vaginal discharge, pain with IC.  Skin: Negative for rash.       Has a couple bite marks on her legs. Does not know what bit her.  Neurological: Negative for sensory change.  Endo/Heme/Allergies:       Denies hair thinning or hair loss     Past Medical History:  Past Medical History:  Diagnosis Date  . Previous cesarean section 2017   non reassuring FHT    Past Surgical History:  Past Surgical History:  Procedure Laterality  Date  . CESAREAN SECTION N/A 01/06/2016   Performed by Malachy Mood, MD at [facility] ORS    Gynecologic History: Patient's last menstrual period was 11/16/2016 (exact date).  Obstetric History: [id number] OB History  Gravida Para Term Preterm AB Living  4 2 2   2 2   SAB TAB Ectopic Multiple Live Births    2     2    # Outcome Date GA Lbr Len/2nd Weight Sex Delivery Anes PTL Lv  4 Term 01/06/16    M CS-LTranv   LIV  3 Term 08/14/00    F Vag-Spont   LIV  2 TAB           1 TAB              Family History:  Family History  Problem Relation Age of Onset  . Diabetes Maternal Grandmother   . Liver cancer Maternal Grandfather   . Stroke Neg Hx   . Breast cancer Neg Hx   . Ovarian cancer Neg Hx     Social History:  Social History   Socioeconomic History  . Marital status: Single    Spouse name: Not on file  . Number of children: 2  . Years of education: Not on file  . Highest education level: Not on file  Social Needs  . Financial resource strain: Not on file  . Food insecurity - worry: Not on file  . Food insecurity - inability: Not on file  . Transportation needs - medical: Not on file  . Transportation  needs - non-medical: Not on file  Occupational History  . Not on file  Tobacco Use  . Smoking status: Current Some Day Smoker    Types: Cigarettes    Last attempt to quit: 11/24/2016    Years since quitting: 0.0  . Smokeless tobacco: Never Used  . Tobacco comment: has not smoked in 1-2 weeks  Substance and Sexual Activity  . Alcohol use: No    Frequency: Never  . Drug use: No  . Sexual activity: Yes    Partners: Male    Birth control/protection: None    Comment: occasional condoms  Other Topics Concern  . Not on file  Social History Narrative  . Not on file    Allergies:  No Known Allergies  Medications: Prior to Admission medications   Medication Sig Start Date End Date Taking? Authorizing Provider  Multiple Vitamin (MULTIVITAMIN) tablet Take 1 tablet  daily by mouth.   Yes [provider]    Physical Exam Vitals:BP (!) 92/58   Pulse 71   Ht 5' 5"  (1.651 m)   Wt 124 lb (56.2 kg)   LMP 11/16/2016 (Exact Date)   Breastfeeding? No   BMI 20.63 kg/m  Patient's last menstrual period was 11/16/2016 (exact date).  Physical Exam  Constitutional: She is oriented to person, place, and time. She appears well-developed and well-nourished.  Appears tired  HENT:  Head: Normocephalic and atraumatic.  Eyes:  Conjunctiva anicteric  Neck: No thyromegaly present.  Cardiovascular: Normal rate, regular rhythm and normal heart sounds.  Respiratory: Effort normal.  GI: Soft. She exhibits no distension and no mass. There is no tenderness. There is no guarding.  Genitourinary:  Genitourinary Comments: Vulva: no lesions or inflammation Vagina: white discharge, NT, no bleeding Cervix: closed, mobile, NT Uterus: AV, mobile, NSSC, NT Adnexa: no masses or tenderness bilaterally Rectal: swollen hemorrhoid and perineum  Musculoskeletal: Normal range of motion.  Neurological: She is alert and oriented to person, place, and time.  Brachial DTRs +1 to +2  Skin: Skin is warm and dry. No rash noted. No erythema.  Psychiatric: Her speech is normal and behavior is normal. Thought content normal. Cognition and memory are normal.  Affect a little flat   Results for orders placed or performed in visit on 12/13/16 (from the past 24 hour(s))  POCT Urinalysis Dipstick     Status: Abnormal   Collection Time: 12/13/16  9:47 AM  Result Value Ref Range   Color, UA yellow    Clarity, UA clear    Glucose, UA negative    Bilirubin, UA negative    Ketones, UA trace    Spec Grav, UA 1.025 1.010 - 1.025   Blood, UA trace    pH, UA 6.0 5.0 - 8.0   Protein, UA trace    Urobilinogen, UA 0.2 0.2 or 1.0 E.U./dL   Nitrite, UA negative    Leukocytes, UA Negative Negative  POCT urine pregnancy     Status: Normal   Collection Time: 12/13/16  9:48 AM  Result Value  Ref Range   Preg Test, Ur Negative Negative     Assessment: 36 y.o. [id number] with lower abdominal pain/ postprandial diarrhea/ malaise/ and weight loss  Plan: CBC, TSH, CMP Referral to Gastroenterology  Dalia Heading, CNM

## 2016-12-13 NOTE — Addendum Note (Signed)
Addended by: Dalia Heading on: 12/13/2016 04:35 PM   Modules accepted: Orders

## 2016-12-14 ENCOUNTER — Telehealth: Payer: Self-pay | Admitting: Certified Nurse Midwife

## 2016-12-14 LAB — TSH: TSH: 1.09 u[IU]/mL (ref 0.450–4.500)

## 2016-12-14 LAB — CBC WITH DIFFERENTIAL/PLATELET
BASOS ABS: 0 10*3/uL (ref 0.0–0.2)
Basos: 1 %
EOS (ABSOLUTE): 0.1 10*3/uL (ref 0.0–0.4)
Eos: 1 %
HEMOGLOBIN: 9.9 g/dL — AB (ref 11.1–15.9)
Hematocrit: 31.9 % — ABNORMAL LOW (ref 34.0–46.6)
IMMATURE GRANS (ABS): 0 10*3/uL (ref 0.0–0.1)
Immature Granulocytes: 0 %
LYMPHS ABS: 2.9 10*3/uL (ref 0.7–3.1)
LYMPHS: 33 %
MCH: 23.9 pg — ABNORMAL LOW (ref 26.6–33.0)
MCHC: 31 g/dL — ABNORMAL LOW (ref 31.5–35.7)
MCV: 77 fL — ABNORMAL LOW (ref 79–97)
MONOCYTES: 5 %
Monocytes Absolute: 0.4 10*3/uL (ref 0.1–0.9)
NEUTROS PCT: 60 %
Neutrophils Absolute: 5.4 10*3/uL (ref 1.4–7.0)
PLATELETS: 341 10*3/uL (ref 150–379)
RBC: 4.15 x10E6/uL (ref 3.77–5.28)
RDW: 17.2 % — AB (ref 12.3–15.4)
WBC: 8.8 10*3/uL (ref 3.4–10.8)

## 2016-12-14 LAB — COMPREHENSIVE METABOLIC PANEL
ALT: 6 IU/L (ref 0–32)
AST: 12 IU/L (ref 0–40)
Albumin/Globulin Ratio: 0.9 — ABNORMAL LOW (ref 1.2–2.2)
Albumin: 3.5 g/dL (ref 3.5–5.5)
Alkaline Phosphatase: 64 IU/L (ref 39–117)
BUN/Creatinine Ratio: 13 (ref 9–23)
BUN: 12 mg/dL (ref 6–20)
Bilirubin Total: 0.2 mg/dL (ref 0.0–1.2)
CALCIUM: 8.8 mg/dL (ref 8.7–10.2)
CO2: 24 mmol/L (ref 20–29)
CREATININE: 0.93 mg/dL (ref 0.57–1.00)
Chloride: 103 mmol/L (ref 96–106)
GFR, EST AFRICAN AMERICAN: 90 mL/min/{1.73_m2} (ref >59)
GFR, EST NON AFRICAN AMERICAN: 78 mL/min/{1.73_m2} (ref >59)
GLOBULIN, TOTAL: 3.8 g/dL (ref 1.5–4.5)
Glucose: 73 mg/dL (ref 65–99)
Potassium: 4.2 mmol/L (ref 3.5–5.2)
Sodium: 141 mmol/L (ref 134–144)
TOTAL PROTEIN: 7.3 g/dL (ref 6.0–8.5)

## 2016-12-14 NOTE — Telephone Encounter (Signed)
Called with results of labs: Has microcytic hypochromic anemia with hmg 9.9 gm/dl. Advised to take some iron (ferrous sulfate). All other labs look OK-normal LFTs and kidney function. TSH is also normal. Has appointment with GI on 27 Nov. Dalia Heading, CNM

## 2016-12-17 LAB — GC/CHLAMYDIA PROBE AMP
Chlamydia trachomatis, NAA: NEGATIVE
Neisseria gonorrhoeae by PCR: NEGATIVE

## 2016-12-20 ENCOUNTER — Other Ambulatory Visit: Payer: Self-pay

## 2016-12-20 ENCOUNTER — Encounter: Payer: Self-pay | Admitting: Gastroenterology

## 2016-12-20 ENCOUNTER — Encounter (INDEPENDENT_AMBULATORY_CARE_PROVIDER_SITE_OTHER): Payer: Self-pay

## 2016-12-20 ENCOUNTER — Ambulatory Visit: Payer: 59 | Admitting: Gastroenterology

## 2016-12-20 VITALS — BP 101/65 | HR 79 | Temp 98.8°F | Ht 65.0 in | Wt 126.6 lb

## 2016-12-20 DIAGNOSIS — R634 Abnormal weight loss: Secondary | ICD-10-CM | POA: Diagnosis not present

## 2016-12-20 DIAGNOSIS — D509 Iron deficiency anemia, unspecified: Secondary | ICD-10-CM

## 2016-12-20 DIAGNOSIS — K529 Noninfective gastroenteritis and colitis, unspecified: Secondary | ICD-10-CM | POA: Diagnosis not present

## 2016-12-20 NOTE — Progress Notes (Signed)
[name], MD [address]  [place]  [place], [place] [address]  Main: [phone]  Fax: [phone]    Gastroenterology Consultation  Referring Provider:     Dalia Heading, CNM Primary Care Physician:  Patient, No Pcp Per Primary Gastroenterologist:  Dr. [name] Reason for Consultation:     Diarrhea and weight loss        HPI:   Christina Obrien is a 36 y.o. female referred by [facility]  for consultation & management of chronic diarrhea and weight loss.   Onset of diarrhea and weight loss since early part of summer Nonbloody diarrhea, post prandial, 72mn after eating, lower abdominal cramps, gassy.  She denies nocturnal diarrhea, urgency or incontinence.  She denies epigastric pain, nausea, vomiting.  She used to have regular bowel movements about 1 year ago. Had delivered healthy baby in dec 2017, underwent C-section, she started loosing weight since march/april. She had intermittent loose stools during pregnancy also.  She denies travel outside [place] camping or using antibiotics, NSAIDs or sick contacts She has h/o anemia, first detected in 12/2015 and recently found to have microcytic anemia.  She is currently taking women's multivitamin daily She was also losing weight before pregnancy. Baseline weight is 150-160s.  Today she weighs about 126 pounds She reports that she was tested for HIV at the time of pregnancy and it was negative, TSH was normal, CMP was normal Used to smoke cigarettes regularly 1pack last for 2-3days since early 20s Stopped in pregnancy Restarted after delivery, cut it back since onset of symptoms as she felt smoking made her symptoms worse She has not tried any medications for diarrhea or abdominal cramps  NSAIDs: None She denies taking any over-the-counter supplements other than multivitamin Antiplts/Anticoagulants/Anti thrombotics: none  GI Procedures: none Denies having any GI surgeries Smokes cigarettes  occasional  ETOH social, denies illicit drug use No fam h/o IBD, GI malignancy 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 Previous cesarean section 2017   non reassuring FHT    Past Surgical History:  Procedure Laterality Date  . CESAREAN SECTION N/A 01/06/2016   Procedure: CESAREAN SECTION;  Surgeon: AMalachy Mood MD;  Location: [facility] ORS;  Service: Obstetrics;  Laterality: N/A;    Prior to Admission medications   Medication Sig Start Date End Date Taking? Authorizing Provider  Multiple Vitamin (MULTIVITAMIN) tablet Take 1 tablet daily by mouth.   Yes [provider]    Family History  Problem Relation Age of Onset  . Diabetes Maternal Grandmother   . Liver cancer Maternal Grandfather   . Stroke Neg Hx   . Breast cancer Neg Hx   . Ovarian cancer Neg Hx      Social History   Tobacco Use  . Smoking status: Current Some Day Smoker    Types: Cigarettes    Last attempt to quit: 11/24/2016    Years since quitting: 0.0  . Smokeless tobacco: Never Used  . Tobacco comment: has not smoked in 1-2 weeks  Substance Use Topics  . Alcohol use: Not on file  . Drug use: Not on file    Allergies as of 12/20/2016  . (No Known Allergies)    Review of Systems:    All systems reviewed and negative except where noted in HPI.   Physical Exam:  BP 101/65   Pulse 79   Temp 98.8 F (37.1 C) (Oral)  Ht 5' 5"  (1.651 m)   Wt 126 lb 9.6 oz (57.4 kg)   BMI 21.07 kg/m  No LMP recorded.  General:   Alert, thin built, pleasant and cooperative in NAD Head:  Normocephalic and atraumatic. Eyes:  Sclera clear, no icterus.   Conjunctiva pink. Ears:  Normal auditory acuity. Nose:  No deformity, discharge, or lesions. Mouth:  No deformity or lesions,oropharynx pink & moist. Neck:  Supple; no masses or thyromegaly. Lungs:  Respirations even and unlabored.  Clear throughout to auscultation.   No wheezes, crackles, or rhonchi.  No acute distress. Heart:  Regular rate and rhythm; no murmurs, clicks, rubs, or gallops. Abdomen:  Normal bowel sounds. Soft, non-tender and non-distended without masses, hepatosplenomegaly or hernias noted.  No guarding or rebound tenderness.   Rectal: Nor performed Msk:  Symmetrical without gross deformities. Good, equal movement & strength bilaterally. Pulses:  Normal pulses noted. Extremities:  No clubbing or edema.  No cyanosis. Neurologic:  Alert and oriented x3;  grossly normal neurologically. Skin:  Intact without significant lesions or rashes. No jaundice. Lymph Nodes:  No significant cervical adenopathy. Psych:  Alert and cooperative. Normal mood and affect.  Imaging Studies: None  Assessment and Plan:   Christina Obrien is a 36 y.o. female with microcytic anemia, chronic nonbloody diarrhea and unintentional weight loss  Differentials include inflammatory or infectious or secondary to malabsorption or less likely osmotic or secretory  -Stool studies including C. difficile and GI pathogen panel -Pancreatic fecal elastase, fecal Cal protectin -CRP, ESR, TSH, cortisol, HIV, acute hepatitis panel, hemoglobin A1c, fecal lactoferrin, gold quantiferon -EGD and colonoscopies with biopsies -We will try dicyclomine and Imodium for symptom relief  Microcytic anemia: Probably iron deficiency given recent pregnancy -Check ferritin, iron studies, B12 and folate levels -She will continue women's multivitamin for now  Follow up in 2 months or sooner based on the above workup   [name], MD

## 2016-12-20 NOTE — Progress Notes (Deleted)
[name], MD [address]  [place]  [place], [place] [address]  Main: [phone]  Fax: [phone] Pager: [phone]   Consultation  Referring Provider:     Dalia Heading, CNM Primary Care Physician:  Patient, No Pcp Per Primary Gastroenterologist:  Dr. Marland Kitchen         Reason for Consultation:     ***  Date of Admission:  (Not on file) Date of Consultation:  12/20/2016         HPI:   Christina Obrien is a 36 y.o. female *** Diarrhea and weight loss since early part of summer Nonbloody diarrhea, post prandial, 39mn after eating, lower abdominal cramps, gassy Had delivered healthy baby in dec 2017, started loosing weight since march/april. She has intermittent loose stools during pregnancy also She was also losing weight before pregnancy. Baseline weight is 150-160s Used to smoke cigarettes regularly 1pack last for 2-3days since early 20s Stopped in pregnancy Restarted after delivery, cut it back since onset of symptoms as she felt it made worse  NSAIDs: None  Antiplts/Anticoagulants/Anti thrombotics: none  GI Procedures: none Smokes occasional  ETOH social No fam h/o IBD, GI malignancy Works as a bGeologist, engineering single  Past Medical History:  Diagnosis Date  . Previous cesarean section 2017   non reassuring FHT    Past Surgical History:  Procedure Laterality Date  . CESAREAN SECTION N/A 01/06/2016   Procedure: CESAREAN SECTION;  Surgeon: AMalachy Mood MD;  Location: [facility] ORS;  Service: Obstetrics;  Laterality: N/A;    Prior to Admission medications   Medication Sig Start Date End Date Taking? Authorizing Provider  Multiple Vitamin (MULTIVITAMIN) tablet Take 1 tablet daily by mouth.    [provider]    Family History  Problem Relation Age of Onset  . Diabetes Maternal Grandmother   . Liver cancer Maternal Grandfather   . Stroke Neg Hx   . Breast cancer Neg Hx   . Ovarian cancer Neg Hx      Social History    Tobacco Use  . Smoking status: Current Some Day Smoker    Types: Cigarettes    Last attempt to quit: 11/24/2016    Years since quitting: 0.0  . Smokeless tobacco: Never Used  . Tobacco comment: has not smoked in 1-2 weeks  Substance Use Topics  . Alcohol use: No    Frequency: Never  . Drug use: No    Allergies as of 12/20/2016  . (No Known Allergies)    Review of Systems:    All systems reviewed and negative except where noted in HPI.   Physical Exam:  Vital signs in last 24 hours: @VSRANGES @   General:   Pleasant, cooperative in NAD Head:  Normocephalic and atraumatic. Eyes:   No icterus.   Conjunctiva pink. PERRLA. Ears:  Normal auditory acuity. Neck:  Supple; no masses or thyroidomegaly Lungs: Respirations even and unlabored. Lungs clear to auscultation bilaterally.   No wheezes, crackles, or rhonchi.  Heart:  Regular rate and rhythm;  Without murmur, clicks, rubs or gallops Abdomen:  Soft, nondistended, nontender. Normal bowel sounds. No appreciable masses or hepatomegaly.  No rebound or guarding.  Rectal:  Not performed. Msk:  Symmetrical without gross deformities.  Strength***  Extremities:  Without edema, cyanosis or clubbing. Neurologic:  Alert and oriented x3;  grossly normal neurologically. Skin:  Intact without significant lesions or rashes. Cervical Nodes:  No significant cervical adenopathy. Psych:  Alert and cooperative. Normal affect.  LAB RESULTS: CBC Latest Ref Rng & Units 12/13/2016 01/07/2016 01/06/2016  WBC 3.4 - 10.8 x10E3/uL 8.8 15.3(H) 16.5(H)  Hemoglobin 11.1 - 15.9 g/dL 9.9(L) 8.3(L) 10.1(L)  Hematocrit 34.0 - 46.6 % 31.9(L) 25.5(L) 30.8(L)  Platelets 150 - 379 x10E3/uL 341 154 169    BMET BMP Latest Ref Rng & Units 12/13/2016  Glucose 65 - 99 mg/dL 73  BUN 6 - 20 mg/dL 12  Creatinine 0.57 - 1.00 mg/dL 0.93  BUN/Creat Ratio 9 - 23 13  Sodium 134 - 144 mmol/L 141  Potassium 3.5 - 5.2 mmol/L 4.2  Chloride 96 - 106 mmol/L 103  CO2 20 -  29 mmol/L 24  Calcium 8.7 - 10.2 mg/dL 8.8    LFT Hepatic Function Latest Ref Rng & Units 12/13/2016  Total Protein 6.0 - 8.5 g/dL 7.3  Albumin 3.5 - 5.5 g/dL 3.5  AST 0 - 40 IU/L 12  ALT 0 - 32 IU/L 6  Alk Phosphatase 39 - 117 IU/L 64  Total Bilirubin 0.0 - 1.2 mg/dL 0.2     STUDIES: No results found.    Impression / Plan:   Christina Obrien is a 36 y.o. female with ***   Thank you for involving me in the care of this patient.     @RRHLOS @  Sherri Sear, MD  12/20/2016, 2:23 PM   Note: This dictation was prepared with Dragon dictation along with smaller phrase technology. Any transcriptional errors that result from this process are unintentional.

## 2016-12-21 ENCOUNTER — Other Ambulatory Visit
Admission: RE | Admit: 2016-12-21 | Discharge: 2016-12-21 | Disposition: A | Discharge status: Home / Self Care | Payer: 59 | Source: Ambulatory Visit | Attending: Gastroenterology | Admitting: Gastroenterology

## 2016-12-21 DIAGNOSIS — D509 Iron deficiency anemia, unspecified: Secondary | ICD-10-CM | POA: Diagnosis present

## 2016-12-21 DIAGNOSIS — R634 Abnormal weight loss: Secondary | ICD-10-CM | POA: Diagnosis present

## 2016-12-21 DIAGNOSIS — K529 Noninfective gastroenteritis and colitis, unspecified: Secondary | ICD-10-CM | POA: Insufficient documentation

## 2016-12-21 LAB — C-REACTIVE PROTEIN

## 2016-12-21 LAB — VITAMIN B12: VITAMIN B 12: 328 pg/mL (ref 180–914)

## 2016-12-21 LAB — FERRITIN: FERRITIN: 8 ng/mL — AB (ref 11–307)

## 2016-12-21 LAB — IRON AND TIBC
Iron: 25 ug/dL — ABNORMAL LOW (ref 28–170)
Saturation Ratios: 9 % — ABNORMAL LOW (ref 10.4–31.8)
TIBC: 281 ug/dL (ref 250–450)
UIBC: 256 ug/dL

## 2016-12-21 LAB — SEDIMENTATION RATE: Sed Rate: 72 mm/hr — ABNORMAL HIGH (ref 0–20)

## 2016-12-21 LAB — HEMOGLOBIN A1C
HEMOGLOBIN A1C: 5.8 % — AB (ref 4.8–5.6)
Mean Plasma Glucose: 119.76 mg/dL

## 2016-12-22 ENCOUNTER — Other Ambulatory Visit
Admission: RE | Admit: 2016-12-22 | Discharge: 2016-12-22 | Disposition: A | Discharge status: Home / Self Care | Payer: 59 | Source: Ambulatory Visit | Attending: Gastroenterology | Admitting: Gastroenterology

## 2016-12-22 DIAGNOSIS — R634 Abnormal weight loss: Secondary | ICD-10-CM | POA: Insufficient documentation

## 2016-12-22 DIAGNOSIS — K529 Noninfective gastroenteritis and colitis, unspecified: Secondary | ICD-10-CM | POA: Insufficient documentation

## 2016-12-22 DIAGNOSIS — D509 Iron deficiency anemia, unspecified: Secondary | ICD-10-CM | POA: Insufficient documentation

## 2016-12-22 LAB — GASTROINTESTINAL PANEL BY PCR, STOOL (REPLACES STOOL CULTURE)
ASTROVIRUS: NOT DETECTED
Adenovirus F40/41: NOT DETECTED
CYCLOSPORA CAYETANENSIS: NOT DETECTED
Campylobacter species: NOT DETECTED
Cryptosporidium: NOT DETECTED
ENTAMOEBA HISTOLYTICA: NOT DETECTED
ENTEROAGGREGATIVE E COLI (EAEC): NOT DETECTED
ENTEROTOXIGENIC E COLI (ETEC): NOT DETECTED
Enteropathogenic E coli (EPEC): NOT DETECTED
GIARDIA LAMBLIA: NOT DETECTED
NOROVIRUS GI/GII: NOT DETECTED
Plesimonas shigelloides: NOT DETECTED
Rotavirus A: NOT DETECTED
SAPOVIRUS (I, II, IV, AND V): NOT DETECTED
SHIGA LIKE TOXIN PRODUCING E COLI (STEC): NOT DETECTED
Salmonella species: NOT DETECTED
Shigella/Enteroinvasive E coli (EIEC): NOT DETECTED
VIBRIO CHOLERAE: NOT DETECTED
VIBRIO SPECIES: NOT DETECTED
Yersinia enterocolitica: NOT DETECTED

## 2016-12-22 LAB — HEPATITIS PANEL, ACUTE
HEP A IGM: NEGATIVE
HEP B C IGM: NEGATIVE
Hepatitis B Surface Ag: NEGATIVE

## 2016-12-22 LAB — IGG, IGA, IGM
IGG (IMMUNOGLOBIN G), SERUM: 1598 mg/dL (ref 700–1600)
IgA: 563 mg/dL — ABNORMAL HIGH (ref 87–352)
IgM (Immunoglobulin M), Srm: 224 mg/dL — ABNORMAL HIGH (ref 26–217)

## 2016-12-22 LAB — HIV ANTIBODY (ROUTINE TESTING W REFLEX): HIV Screen 4th Generation wRfx: NONREACTIVE

## 2016-12-22 LAB — C DIFFICILE QUICK SCREEN W PCR REFLEX
C DIFFICILE (CDIFF) TOXIN: NEGATIVE
C DIFFICLE (CDIFF) ANTIGEN: NEGATIVE
C Diff interpretation: NOT DETECTED

## 2016-12-22 LAB — LACTOFERRIN, FECAL, QUALITATIVE: LACTOFERRIN, FECAL, QUAL: POSITIVE — AB

## 2016-12-24 LAB — QUANTIFERON-TB GOLD PLUS (RQFGPL)
QUANTIFERON NIL VALUE: 0.04 [IU]/mL
QUANTIFERON TB1 AG VALUE: 0.05 [IU]/mL
QuantiFERON TB2 Ag Value: 0.05 IU/mL

## 2016-12-24 LAB — QUANTIFERON-TB GOLD PLUS: QuantiFERON-TB Gold Plus: NEGATIVE

## 2016-12-26 ENCOUNTER — Encounter: Payer: Self-pay | Admitting: Gastroenterology

## 2016-12-26 LAB — PANCREATIC ELASTASE, FECAL: Pancreatic Elastase-1, Stool: >500 ug Elast./g (ref >200)

## 2016-12-26 NOTE — Telephone Encounter (Signed)
ERROR

## 2016-12-27 ENCOUNTER — Encounter: Payer: Self-pay | Admitting: *Deleted

## 2016-12-27 ENCOUNTER — Telehealth: Payer: Self-pay

## 2016-12-27 ENCOUNTER — Ambulatory Visit
Admission: RE | Admit: 2016-12-27 | Discharge: 2016-12-27 | Disposition: A | Discharge status: Home / Self Care | Payer: Commercial Managed Care - HMO | Source: Ambulatory Visit | Attending: Gastroenterology | Admitting: Gastroenterology

## 2016-12-27 ENCOUNTER — Encounter
Admission: RE | Disposition: A | Discharge status: Home / Self Care | Payer: Self-pay | Source: Ambulatory Visit | Attending: Gastroenterology

## 2016-12-27 ENCOUNTER — Ambulatory Visit: Payer: Commercial Managed Care - HMO | Admitting: Anesthesiology

## 2016-12-27 DIAGNOSIS — K259 Gastric ulcer, unspecified as acute or chronic, without hemorrhage or perforation: Secondary | ICD-10-CM | POA: Insufficient documentation

## 2016-12-27 DIAGNOSIS — K2950 Unspecified chronic gastritis without bleeding: Secondary | ICD-10-CM | POA: Insufficient documentation

## 2016-12-27 DIAGNOSIS — K6289 Other specified diseases of anus and rectum: Secondary | ICD-10-CM | POA: Insufficient documentation

## 2016-12-27 DIAGNOSIS — K529 Noninfective gastroenteritis and colitis, unspecified: Secondary | ICD-10-CM | POA: Diagnosis not present

## 2016-12-27 DIAGNOSIS — D509 Iron deficiency anemia, unspecified: Secondary | ICD-10-CM | POA: Insufficient documentation

## 2016-12-27 DIAGNOSIS — K2980 Duodenitis without bleeding: Secondary | ICD-10-CM | POA: Diagnosis not present

## 2016-12-27 DIAGNOSIS — D122 Benign neoplasm of ascending colon: Secondary | ICD-10-CM

## 2016-12-27 DIAGNOSIS — K5190 Ulcerative colitis, unspecified, without complications: Secondary | ICD-10-CM | POA: Diagnosis not present

## 2016-12-27 DIAGNOSIS — K2960 Other gastritis without bleeding: Secondary | ICD-10-CM

## 2016-12-27 DIAGNOSIS — F17210 Nicotine dependence, cigarettes, uncomplicated: Secondary | ICD-10-CM | POA: Diagnosis not present

## 2016-12-27 DIAGNOSIS — R634 Abnormal weight loss: Secondary | ICD-10-CM

## 2016-12-27 HISTORY — PX: COLONOSCOPY WITH PROPOFOL: SHX5780

## 2016-12-27 HISTORY — DX: Other specified health status: Z78.9

## 2016-12-27 HISTORY — PX: ESOPHAGOGASTRODUODENOSCOPY (EGD) WITH PROPOFOL: SHX5813

## 2016-12-27 LAB — POCT PREGNANCY, URINE: PREG TEST UR: NEGATIVE

## 2016-12-27 LAB — CALPROTECTIN, FECAL: CALPROTECTIN, FECAL: 475 ug/g — AB (ref 0–120)

## 2016-12-27 SURGERY — ESOPHAGOGASTRODUODENOSCOPY (EGD) WITH PROPOFOL
Anesthesia: General

## 2016-12-27 MED ORDER — MIDAZOLAM HCL 2 MG/2ML IJ SOLN
INTRAMUSCULAR | Status: AC
  Filled 2016-12-27: qty 2

## 2016-12-27 MED ORDER — PROPOFOL 500 MG/50ML IV EMUL
INTRAVENOUS | Status: DC | PRN
Start: 2016-12-27 — End: 2016-12-27
  Administered 2016-12-27: 150 ug/kg/min via INTRAVENOUS

## 2016-12-27 MED ORDER — LIDOCAINE HCL (PF) 2 % IJ SOLN
INTRAMUSCULAR | Status: AC
  Filled 2016-12-27: qty 10

## 2016-12-27 MED ORDER — LIDOCAINE HCL (CARDIAC) 20 MG/ML IV SOLN
INTRAVENOUS | Status: DC | PRN
  Administered 2016-12-27: 50 mg via INTRAVENOUS

## 2016-12-27 MED ORDER — MIDAZOLAM HCL 2 MG/2ML IJ SOLN
INTRAMUSCULAR | Status: DC | PRN
  Administered 2016-12-27: 2 mg via INTRAVENOUS

## 2016-12-27 MED ORDER — SODIUM CHLORIDE 0.9 % IV SOLN
INTRAVENOUS | Status: DC
  Administered 2016-12-27: 1000 mL via INTRAVENOUS

## 2016-12-27 MED ORDER — PROPOFOL 500 MG/50ML IV EMUL
INTRAVENOUS | Status: AC
  Filled 2016-12-27: qty 50

## 2016-12-27 MED ORDER — PROPOFOL 10 MG/ML IV BOLUS
INTRAVENOUS | Status: DC | PRN
  Administered 2016-12-27: 60 mg via INTRAVENOUS
  Administered 2016-12-27: 40 mg via INTRAVENOUS

## 2016-12-27 NOTE — Telephone Encounter (Signed)
Patient has been notified of her lab results.  Thanks [employer]

## 2016-12-27 NOTE — Op Note (Signed)
[facility] Patient Name: Christina Obrien Procedure Date: 12/27/2016 10:14 AM MRN: [id number] Account #: [id number] Date of Birth: 10-Feb-1980 Admit Type: Outpatient Age: 36 Room: [facility] ENDO ROOM 1 Gender: Female Note Status: Finalized Procedure:            Colonoscopy Indications:          Chronic diarrhea, Iron deficiency anemia Providers:            Jonathon Bellows MD, MD Referring MD:         Jesus Genera. Danise Mina (Referring MD) Medicines:            Monitored Anesthesia Care Complications:        No immediate complications. Procedure:            Pre-Anesthesia Assessment:                       - Prior to the procedure, a History and Physical was                        performed, and patient medications, allergies and                        sensitivities were reviewed. The patient's tolerance of                        previous anesthesia was reviewed.                       - The risks and benefits of the procedure and the                        sedation options and risks were discussed with the                        patient. All questions were answered and informed                        consent was obtained.                       - ASA Grade Assessment: II - A patient with mild                        systemic disease.                       After obtaining informed consent, the colonoscope was                        passed under direct vision. Throughout the procedure,                        the patient's blood pressure, pulse, and oxygen                        saturations were monitored continuously. The                        Colonoscope was introduced through the anus and  advanced to the the cecum, identified by the                        appendiceal orifice, IC valve and transillumination.                        The colonoscopy was performed with ease. The patient                        tolerated the procedure well. The quality  of the bowel                        preparation was poor. Findings:      The perianal and digital rectal examinations were normal.      Patchy moderate inflammation characterized by congestion (edema),       erythema, loss of vascularity, mucus and shallow ulcerations was found       in the Rt colon , left colon including sigmoid and less pronounced in       the rectum and transverse colon . Biopsies were taken with a cold       forceps for histology.      A 6 mm polyp was found in the ascending colon. The polyp was sessile.       The polyp was removed with a cold biopsy forceps. Resection and       retrieval were complete.      The exam was otherwise without abnormality on direct and retroflexion       views. Impression:           - Preparation of the colon was poor.                       - Patchy moderate inflammation was found in the entire                        examined colon secondary to colitis. Biopsied.                       - One 6 mm polyp in the ascending colon, removed with a                        cold biopsy forceps. Resected and retrieved.                       - The examination was otherwise normal on direct and                        retroflexion views. Recommendation:       - Discharge patient to home (with escort).                       - Resume previous diet.                       - 1. Stop Advil which she has been taking long term ,                        it could very well cause an NSAID associated colitis  with all these symptoms                       2. F/u with me in the clinic in 2 weeks - Dr Marius Ditch is                        on vacation. If symptoms are better then will not need                        any medication , if no better and based on results of                        biopsy will discuss medications.                       3. The prep was inaderquate for polyp detection. Procedure Code(s):    --- Professional ---                        [phone], Colonoscopy, flexible; with biopsy, single or                        multiple Diagnosis Code(s):    --- Professional ---                       K52.9, Noninfective gastroenteritis and colitis,                        unspecified                       D12.2, Benign neoplasm of ascending colon                       D50.9, Iron deficiency anemia, unspecified CPT copyright 2016 American Medical Association. All rights reserved. The codes documented in this report are preliminary and upon coder review may  be revised to meet current compliance requirements. Jonathon Bellows, MD Jonathon Bellows MD, MD 12/27/2016 10:51:26 AM This report has been signed electronically. Number of Addenda: 0 Note Initiated On: 12/27/2016 10:14 AM Scope Withdrawal Time: 0 hours 13 minutes 0 seconds  Total Procedure Duration: 0 hours 15 minutes 32 seconds       [facility]

## 2016-12-27 NOTE — Op Note (Addendum)
[facility] Patient Name: Christina Obrien Procedure Date: 12/27/2016 10:14 AM MRN: [id number] Account #: [id number] Date of Birth: February 06, 1980 Admit Type: Outpatient Age: 36 Room: [facility] ENDO ROOM 1 Gender: Female Note Status: Supervisor Override Procedure:            Upper GI endoscopy Indications:          Iron deficiency anemia Providers:            Jonathon Bellows MD, MD Referring MD:         Jesus Genera. Danise Mina (Referring MD) Medicines:            Monitored Anesthesia Care Complications:        No immediate complications. Procedure:            Pre-Anesthesia Assessment:                       - Prior to the procedure, a History and Physical was                        performed, and patient medications, allergies and                        sensitivities were reviewed. The patient's tolerance of                        previous anesthesia was reviewed.                       - The risks and benefits of the procedure and the                        sedation options and risks were discussed with the                        patient. All questions were answered and informed                        consent was obtained.                       - ASA Grade Assessment: II - A patient with mild                        systemic disease.                       After obtaining informed consent, the endoscope was                        passed under direct vision. Throughout the procedure,                        the patient's blood pressure, pulse, and oxygen                        saturations were monitored continuously. The Endoscope                        was introduced through the mouth, and advanced to the  third part of duodenum. The upper GI endoscopy was                        accomplished with ease. The patient tolerated the                        procedure well. Findings:      The examined duodenum was normal. Biopsies for histology were taken  with       a cold forceps for evaluation of celiac disease.      The esophagus was normal.      Patchy moderate inflammation characterized by adherent blood, congestion       (edema), erosions and erythema was found in the gastric antrum. Biopsies       were taken with a cold forceps for histology.      Two non-bleeding superficial gastric ulcers with no stigmata of bleeding       were found in the stomach. The largest lesion was 4 mm in largest       dimension. Biopsies were taken with a cold forceps for histology. Impression:           - Normal examined duodenum. Biopsied.                       - Normal esophagus.                       - Gastritis. Biopsied. Recommendation:       - Await pathology results.                       - Perform a colonoscopy today.                       - Stop NSAId use, likely cause if ulcer. Procedure Code(s):    --- Professional ---                       [id number], Esophagogastroduodenoscopy, flexible, transoral;                        with biopsy, single or multiple Diagnosis Code(s):    --- Professional ---                       K29.70, Gastritis, unspecified, without bleeding                       D50.9, Iron deficiency anemia, unspecified CPT copyright 2018 American Medical Association. All rights reserved. The codes documented in this report are preliminary and upon coder review may  be revised to meet current compliance requirements. Jonathon Bellows, MD Jonathon Bellows MD, MD 12/27/2016 10:27:38 AM This report has been signed electronically. Number of Addenda: 0 Note Initiated On: 12/27/2016 10:14 AM      [facility]

## 2016-12-27 NOTE — Anesthesia Procedure Notes (Signed)
Performed by: Lance Muss, CRNA Pre-anesthesia Checklist: Patient identified, Emergency Drugs available, Suction available, Patient being monitored and Timeout performed Patient Re-evaluated:Patient Re-evaluated prior to induction Oxygen Delivery Method: Nasal cannula Induction Type: IV induction

## 2016-12-27 NOTE — Anesthesia Preprocedure Evaluation (Signed)
Anesthesia Evaluation  Patient identified by MRN, date of birth, ID band Patient awake    Reviewed: Allergy & Precautions, NPO status , Patient's Chart, lab work & pertinent test results  History of Anesthesia Complications Negative for: history of anesthetic complications  Airway Mallampati: III     Mouth opening: Limited Mouth Opening  Dental   Pulmonary neg sleep apnea, neg COPD, Current Smoker,           Cardiovascular (-) hypertension(-) Past MI and (-) CHF (-) dysrhythmias (-) Valvular Problems/Murmurs     Neuro/Psych neg Seizures    GI/Hepatic Neg liver ROS, neg GERD  ,  Endo/Other  neg diabetes  Renal/GU negative Renal ROS     Musculoskeletal   Abdominal   Peds  Hematology   Anesthesia Other Findings   Reproductive/Obstetrics                             Anesthesia Physical Anesthesia Plan  ASA: II  Anesthesia Plan: General   Post-op Pain Management:    Induction:   PONV Risk Score and Plan: 2 and TIVA, Propofol infusion and Treatment may vary due to age or medical condition  Airway Management Planned: Nasal Cannula  Additional Equipment:   Intra-op Plan:   Post-operative Plan:   Informed Consent: I have reviewed the patients History and Physical, chart, labs and discussed the procedure including the risks, benefits and alternatives for the proposed anesthesia with the patient or authorized representative who has indicated his/her understanding and acceptance.     Plan Discussed with:   Anesthesia Plan Comments:         Anesthesia Quick Evaluation

## 2016-12-27 NOTE — H&P (Signed)
Jonathon Bellows, MD [address], [place], [place], [place], [address] [address], [place], [place], [place], [address] Phone: [phone]  Fax: [phone]  Primary Care Physician:  Dalia Heading, CNM   Pre-Procedure History & Physical: HPI:  Christina Obrien is a 36 y.o. female is here for an endoscopy and colonoscopy    Past Medical History:  Diagnosis Date  . Medical history non-contributory   . Previous cesarean section 2017   non reassuring FHT    Past Surgical History:  Procedure Laterality Date  . CESAREAN SECTION N/A 01/06/2016   Procedure: CESAREAN SECTION;  Surgeon: Malachy Mood, MD;  Location: [facility] ORS;  Service: Obstetrics;  Laterality: N/A;    Prior to Admission medications   Medication Sig Start Date End Date Taking? Authorizing Provider  Multiple Vitamin (MULTIVITAMIN) tablet Take 1 tablet daily by mouth.    [provider]    Allergies as of 12/20/2016  . (No Known Allergies)    Family History  Problem Relation Age of Onset  . Diabetes Maternal Grandmother   . Liver cancer Maternal Grandfather   . Stroke Neg Hx   . Breast cancer Neg Hx   . Ovarian cancer Neg Hx     Social History   Socioeconomic History  . Marital status: Single    Spouse name: Not on file  . Number of children: 2  . Years of education: Not on file  . Highest education level: Not on file  Social Needs  . Financial resource strain: Not on file  . Food insecurity - worry: Not on file  . Food insecurity - inability: Not on file  . Transportation needs - medical: Not on file  . Transportation needs - non-medical: Not on file  Occupational History  . Not on file  Tobacco Use  . Smoking status: Current Some Day Smoker    Types: Cigarettes    Last attempt to quit: 11/24/2016    Years since quitting: 0.0  . Smokeless tobacco: Never Used  . Tobacco comment: has not smoked in 1-2 weeks  Substance and Sexual Activity  . Alcohol use: Not on file  .  Drug use: Not on file  . Sexual activity: Yes    Partners: Male    Birth control/protection: None    Comment: occasional condoms  Other Topics Concern  . Not on file  Social History Narrative  . Not on file    Review of Systems: See HPI, otherwise negative ROS  Physical Exam: BP 101/65   Pulse 68   Temp (!) 97.2 F (36.2 C) (Tympanic)   Resp 16   Ht 5' 5"  (1.651 m)   Wt 126 lb (57.2 kg)   SpO2 100%   BMI 20.97 kg/m  General:   Alert,  pleasant and cooperative in NAD Head:  Normocephalic and atraumatic. Neck:  Supple; no masses or thyromegaly. Lungs:  Clear throughout to auscultation, normal respiratory effort.    Heart:  +S1, +S2, Regular rate and rhythm, No edema. Abdomen:  Soft, nontender and nondistended. Normal bowel sounds, without guarding, and without rebound.   Neurologic:  Alert and  oriented x4;  grossly normal neurologically.  Impression/Plan: Christina Obrien is here for an endoscopy and colonoscopy  to be performed for  evaluation of iron deficiency anemia and diarrhea    Risks, benefits, limitations, and alternatives regarding endoscopy have been reviewed with the patient.  Questions have been answered.  All parties agreeable.   Jonathon Bellows,  MD  12/27/2016, 9:40 AM

## 2016-12-27 NOTE — Telephone Encounter (Signed)
-----   Message from Lin Landsman, MD sent at 12/22/2016  9:46 AM EST ----- Please notify pt that she has severe iron deficiency anemia. Recommend oral iron 329m 2-3 times daily with food.  If she is not able to tolerate oral iron, will refer her to hematology for iron infusions. Also, her inflammatory marker is elevated concerning for inflammation. She will certainly need GI evaluation.  -RV

## 2016-12-27 NOTE — Anesthesia Post-op Follow-up Note (Signed)
Anesthesia QCDR form completed.        

## 2016-12-27 NOTE — Anesthesia Postprocedure Evaluation (Signed)
Anesthesia Post Note  Patient: Christina Obrien  Procedure(s) Performed: ESOPHAGOGASTRODUODENOSCOPY (EGD) WITH PROPOFOL (N/A ) COLONOSCOPY WITH PROPOFOL (N/A )  Patient location during evaluation: Endoscopy Anesthesia Type: General Level of consciousness: awake and alert Pain management: pain level controlled Vital Signs Assessment: post-procedure vital signs reviewed and stable Respiratory status: spontaneous breathing and respiratory function stable Cardiovascular status: stable Anesthetic complications: no     Last Vitals:  Vitals:   12/27/16 1050 12/27/16 1052  BP: 120/87 120/87  Pulse: 90 92  Resp: 20 19  Temp:    SpO2: 100% 100%    Last Pain:  Vitals:   12/27/16 0906  TempSrc: Tympanic                 Latera Mclin K

## 2016-12-27 NOTE — Transfer of Care (Signed)
Immediate Anesthesia Transfer of Care Note  Patient: Christina Obrien  Procedure(s) Performed: ESOPHAGOGASTRODUODENOSCOPY (EGD) WITH PROPOFOL (N/A ) COLONOSCOPY WITH PROPOFOL (N/A )  Patient Location: PACU  Anesthesia Type:General  Level of Consciousness: sedated and responds to stimulation  Airway & Oxygen Therapy: Patient Spontanous Breathing and Patient connected to nasal cannula oxygen  Post-op Assessment: Report given to RN and Post -op Vital signs reviewed and stable  Post vital signs: Reviewed and stable  Last Vitals:  Vitals:   12/27/16 1050 12/27/16 1052  BP: 120/87 120/87  Pulse: 90 92  Resp: 20 19  Temp:    SpO2: 100% 100%    Last Pain:  Vitals:   12/27/16 0906  TempSrc: Tympanic         Complications: No apparent anesthesia complications

## 2016-12-28 ENCOUNTER — Encounter: Payer: Self-pay | Admitting: Gastroenterology

## 2016-12-29 ENCOUNTER — Telehealth: Payer: Self-pay | Admitting: Gastroenterology

## 2016-12-29 LAB — SURGICAL PATHOLOGY

## 2016-12-29 NOTE — Telephone Encounter (Signed)
Returned pts phone call lvm.  Thanks [employer]

## 2016-12-29 NOTE — Telephone Encounter (Signed)
Patient left a voice message wanting to know if you called in a rx for her. Please call

## 2017-01-31 ENCOUNTER — Telehealth: Payer: Self-pay

## 2017-01-31 NOTE — Telephone Encounter (Signed)
Contacted patient and advised of results per Dr. Vicente Males.     As discussed after her procedures- she has colitis, gastritis and duodenitis which is very likely from NSAID use. Suggest she follow up with Dr Marius Ditch to discuss next steps. Enquire how she is doing now .    Patient states changing to Tylenol and will follow-up with Dr. Vicente Males.

## 2017-01-31 NOTE — Telephone Encounter (Signed)
-----   Message from Jonathon Bellows, MD sent at 01/30/2017 11:08 AM EST ----- As discussed after her procedures- she has colitis, gastritis and duodenitis which is very likely from NSAID use. Suggest she follow up with Dr Marius Ditch to discuss next steps. Enquire how she is doing now .

## 2017-04-03 ENCOUNTER — Encounter: Payer: Self-pay | Admitting: Gastroenterology

## 2017-04-03 ENCOUNTER — Ambulatory Visit: Payer: 59 | Admitting: Gastroenterology

## 2017-04-03 ENCOUNTER — Encounter (INDEPENDENT_AMBULATORY_CARE_PROVIDER_SITE_OTHER): Payer: Self-pay

## 2017-04-03 ENCOUNTER — Other Ambulatory Visit
Admission: RE | Admit: 2017-04-03 | Discharge: 2017-04-03 | Disposition: A | Discharge status: Home / Self Care | Payer: 59 | Source: Ambulatory Visit | Attending: Gastroenterology | Admitting: Gastroenterology

## 2017-04-03 VITALS — BP 90/59 | HR 89 | Temp 99.4°F | Ht 65.0 in | Wt 122.0 lb

## 2017-04-03 DIAGNOSIS — D649 Anemia, unspecified: Secondary | ICD-10-CM | POA: Diagnosis not present

## 2017-04-03 DIAGNOSIS — K5100 Ulcerative (chronic) pancolitis without complications: Secondary | ICD-10-CM

## 2017-04-03 DIAGNOSIS — R634 Abnormal weight loss: Secondary | ICD-10-CM

## 2017-04-03 LAB — CBC
HCT: 34.5 % — ABNORMAL LOW (ref 35.0–47.0)
Hemoglobin: 11.2 g/dL — ABNORMAL LOW (ref 12.0–16.0)
MCH: 25.4 pg — AB (ref 26.0–34.0)
MCHC: 32.4 g/dL (ref 32.0–36.0)
MCV: 78.3 fL — ABNORMAL LOW (ref 80.0–100.0)
Platelets: 263 10*3/uL (ref 150–440)
RBC: 4.41 MIL/uL (ref 3.80–5.20)
RDW: 16.3 % — AB (ref 11.5–14.5)
WBC: 7.5 10*3/uL (ref 3.6–11.0)

## 2017-04-03 LAB — VITAMIN B12: VITAMIN B 12: 249 pg/mL (ref 180–914)

## 2017-04-03 LAB — FERRITIN: Ferritin: 8 ng/mL — ABNORMAL LOW (ref 11–307)

## 2017-04-03 LAB — IRON AND TIBC
IRON: 18 ug/dL — AB (ref 28–170)
Saturation Ratios: 6 % — ABNORMAL LOW (ref 10.4–31.8)
TIBC: 315 ug/dL (ref 250–450)
UIBC: 297 ug/dL

## 2017-04-03 LAB — C-REACTIVE PROTEIN: CRP: 1.2 mg/dL — ABNORMAL HIGH (ref <1.0)

## 2017-04-03 MED ORDER — MESALAMINE 400 MG PO CPDR: 2400.0000 mg | DELAYED_RELEASE_CAPSULE | Freq: Two times a day (BID) | ORAL | 2 refills | Status: DC

## 2017-04-03 NOTE — Progress Notes (Signed)
[name], MD [address]  [place]  [place], [place] [address]  Main: [phone]  Fax: [phone]    Gastroenterology Consultation  Referring Provider:     Dalia Heading, CNM Primary Care Physician:  Dalia Heading, CNM Primary Gastroenterologist:  Dr. [name] Reason for Consultation:     Diarrhea and weight loss        HPI:   Christina Obrien is a 37 y.o. female referred by [facility]  for consultation & management of chronic diarrhea and weight loss.   Onset of diarrhea and weight loss since early part of summer Nonbloody diarrhea, post prandial, 64mn after eating, lower abdominal cramps, gassy.  She denies nocturnal diarrhea, urgency or incontinence.  She denies epigastric pain, nausea, vomiting.  She used to have regular bowel movements about 1 year ago. Had delivered healthy baby in dec 2017, underwent C-section, she started loosing weight since march/april. She had intermittent loose stools during pregnancy also.  She denies travel outside [place] camping or using antibiotics, NSAIDs or sick contacts She has h/o anemia, first detected in 12/2015 and recently found to have microcytic anemia.  She is currently taking women's multivitamin daily She was also losing weight before pregnancy. Baseline weight is 150-160s.  Today she weighs about 126 pounds She reports that she was tested for HIV at the time of pregnancy and it was negative, TSH was normal, CMP was normal Used to smoke cigarettes regularly 1pack last for 2-3days since early 20s Stopped in pregnancy Restarted after delivery, cut it back since onset of symptoms as she felt smoking made her symptoms worse She has not tried any medications for diarrhea or abdominal cramps  Follow-up visit 04/03/2017: Patient underwent upper endoscopy and colonoscopy. Upper endoscopy revealed nonspecific gastritis. There was no evidence of H. Pylori. Colonoscopy revealed mild inflammation in the  entire colon from rectum to cecum. Terminal ileum was not examined. Pathology revealed mild active colitis including rectum. Patient is here for follow-up visit today. She continues to feel weak, lost about 4 pounds Her GI symptoms remain unchanged. She is taking women's multivitamin and alfa-alfa supplements daily. She reports not taking NSAIDs since the GI procedures as Dr. AVicente Malesrecommended her to stop NSAIDs and thought her inflammation could be NSAID related injury. She continues to smoke, 1 pack last for 2 days  NSAIDs: she used to take ibuprofen about once a week or less for occasional headaches She denies taking any over-the-counter supplements other than multivitamin Antiplts/Anticoagulants/Anti thrombotics: none  GI Procedures:  EGD and colonoscopy on 12/27/2016 by Dr. AVicente MalesThe esophagus was normal. Patchy moderate inflammation characterized by adherent blood, congestion (edema), erosions and erythema was found in the gastric antrum. Biopsies were taken with a cold forceps for histology. Two non-bleeding superficial gastric ulcers with no stigmata of bleeding were found in the stomach. The largest lesion was 4 mm in largest dimension. Biopsies were taken with a cold forceps for histology. Duodenum was inflamed  Colonoscopy - Preparation of the colon was poor. - Patchy moderate inflammation was found in the entire examined colon secondary to colitis. Biopsied. - One 6 mm polyp in the ascending colon, removed with a cold biopsy forceps. Resected and retrieved. - The examination was otherwise normal on direct and retroflexion views. DIAGNOSIS:  A. 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  - NEGATIVE FOR ACTIVE INFLAMMATION, INTRAEPITHELIAL LYMPHOCYTOSIS, AND  INFECTIOUS AGENTS.   B. DUODENUM, THIRD PORTION; COLD  BIOPSY:  - DUODENAL MUCOSA WITH INTACT VILLI.  - NEGATIVE FOR ACTIVE INFLAMMATION, INTRAEPITHELIAL LYMPHOCYTOSIS, AND  INFECTIOUS  AGENTS.   C. STOMACH ADJACENT TO ULCER; COLD BIOPSY:  - ANTRAL-TYPE MUCOSA WITH MILD CHRONIC GASTRITIS, SEE COMMENT.  - REACTIVE FOVEOLAR HYPERPLASIA.  - NEGATIVE FOR HELICOBACTER PYLORI BY IMMUNOHISTOCHEMISTRY ([facility]).   Comment:  There are a few foci of gastric glandular injury surrounded by  lymphocytes, eosinophils, and macrophages. Neutrophils are not  identified. This focally enhanced gastritis pattern is not specific in  adults but can be a medication effect, and can be seen in inflammatory  bowel disease. No granulomas or viral inclusions are seen. One tiny  crushed mucosal fragment shows a few goblet cells but I cannot be sure  of the type of tissue. Control for H pylori IHC stained appropriately.   D. RIGHT COLON; RANDOM COLD BIOPSY:  - MILD ACTIVE COLITIS WITH FEATURES OF CHRONICITY.   E. COLON POLYP, ASCENDING; COLD BIOPSY:  - CHRONIC ULCER IN A BACKGROUND OF ACTIVE COLITIS, SEE COMMENT.   F. TRANSVERSE COLON; RANDOM COLD BIOPSY:  - MILD ACTIVE COLITIS WITH FEATURES OF CHRONICITY.   G. SIGMOID COLON; RANDOM COLD BIOPSY:  - MILD ACTIVE COLITIS WITH FEATURES OF CHRONICITY.   H. RECTUM; RANDOM COLD BIOPSY:  - MILD ACTIVE PROCTITIS WITH FEATURES OF CHRONICITY.   Comment:  All of the samples are negative for dysplasia and malignancy.   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 Medical history non-contributory   . Previous cesarean section 2017   non reassuring FHT    Past Surgical History:  Procedure Laterality Date  . CESAREAN SECTION N/A 01/06/2016   Procedure: CESAREAN SECTION;  Surgeon: Malachy Mood, MD;  Location: [facility] ORS;  Service: Obstetrics;  Laterality: N/A;  . COLONOSCOPY WITH PROPOFOL N/A 12/27/2016   Procedure: COLONOSCOPY WITH PROPOFOL;   Surgeon: Jonathon Bellows, MD;  Location: [facility] ENDOSCOPY;  Service: Gastroenterology;  Laterality: N/A;  . ESOPHAGOGASTRODUODENOSCOPY (EGD) WITH PROPOFOL N/A 12/27/2016   Procedure: ESOPHAGOGASTRODUODENOSCOPY (EGD) WITH PROPOFOL;  Surgeon: Jonathon Bellows, MD;  Location: [facility] ENDOSCOPY;  Service: Gastroenterology;  Laterality: N/A;    Prior to Admission medications   Medication Sig Start Date End Date Taking? Authorizing Provider  Multiple Vitamin (MULTIVITAMIN) tablet Take 1 tablet daily by mouth.   Yes [provider]    Family History  Problem Relation Age of Onset  . Diabetes Maternal Grandmother   . Liver cancer Maternal Grandfather   . Stroke Neg Hx   . Breast cancer Neg Hx   . Ovarian cancer Neg Hx      Social History   Tobacco Use  . Smoking status: Current Some Day Smoker    Types: Cigarettes    Last attempt to quit: 11/24/2016    Years since quitting: 0.3  . Smokeless tobacco: Never Used  . Tobacco comment: has not smoked in 1-2 weeks  Substance Use Topics  . Alcohol use: Not on file  . Drug use: Not on file    Allergies as of 04/03/2017  . (No Known Allergies)    Review of Systems:    All systems reviewed and negative except where noted in HPI.   Physical Exam:  BP (!) 90/59   Pulse 89   Temp 99.4 F (37.4 C) (Oral)   Ht  5' 5"  (1.651 m)   Wt 122 lb (55.3 kg)   BMI 20.30 kg/m  No LMP recorded.  General:   Alert, thin built, pleasant and cooperative in NAD Head:  Normocephalic and atraumatic. Eyes:  Sclera clear, no icterus.   Conjunctiva pink. Ears:  Normal auditory acuity. Nose:  No deformity, discharge, or lesions. Mouth:  No deformity or lesions,oropharynx pink & moist. Neck:  Supple; no masses or thyromegaly. Lungs:  Respirations even and unlabored.  Clear throughout to auscultation.   No wheezes, crackles, or rhonchi. No acute distress. Heart:  Regular rate and rhythm; no murmurs, clicks, rubs, or gallops. Abdomen:  Normal bowel sounds. Soft,  mild diffuse tenderness and non-distended without masses, hepatosplenomegaly or hernias noted.  No guarding or rebound tenderness.   Rectal: Nor performed Msk:  Symmetrical without gross deformities. Good, equal movement & strength bilaterally. Pulses:  Normal pulses noted. Extremities:  No clubbing or edema.  No cyanosis. Neurologic:  Alert and oriented x3;  grossly normal neurologically. Skin:  Intact without significant lesions or rashes. No jaundice. Psych:  Alert and cooperative. Normal mood and affect.  Imaging Studies: None  Assessment and Plan:   Christina Obrien is a 37 y.o. African-American female is here follow-up of microcytic anemia, chronic nonbloody diarrhea and unintentional weight loss. She has significantly elevated fecal calprotectin 475, ESR 72. Her stool studies were negative for infection. Colonoscopy revealed inflammation in the entire colon including rectum,pathology showed mild chronic active colitis in all the segments of colon, there was no evidence of dysplasia. 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  Ulcerative colitis: extensive, mild - Recommend to start oral mesalamine 2.4 g twice a day - will hold off on systemic steroids for now - encouraged her to continue to avoid NSAIDs - Will step up therapy depending on her clinical response in next 3 months - HIV, acute hepatitis panel negative, she will need vaccination against hepatitis A and B - TPMT levels are normal - gold quantiferon negative - educated patient about inflammatory bowel disease,various management options, long-term implications if left untreated including surgery, malignancy, stricture, severe weight loss, severe iron deficiency anemia and importance of medication adherence to control inflammation  Severe iron deficiency anemia - Refer her to [facility] for parenteral iron therapy - she has normal B12 and folate levels -She will continue  women's multivitamin for now  Follow up in 2 months or sooner based on her clinical response Encouraged her to sign up on my chart and contact me as needed   [name], MD

## 2017-04-03 NOTE — Addendum Note (Signed)
Addended by: Vanetta Mulders on: 04/03/2017 02:58 PM   Modules accepted: Orders

## 2017-04-07 ENCOUNTER — Inpatient Hospital Stay: Payer: 59 | Attending: Oncology | Admitting: Oncology

## 2017-04-07 ENCOUNTER — Encounter: Payer: Self-pay | Admitting: Oncology

## 2017-04-07 DIAGNOSIS — D509 Iron deficiency anemia, unspecified: Secondary | ICD-10-CM | POA: Insufficient documentation

## 2017-04-07 DIAGNOSIS — K5190 Ulcerative colitis, unspecified, without complications: Secondary | ICD-10-CM | POA: Diagnosis not present

## 2017-04-07 DIAGNOSIS — D500 Iron deficiency anemia secondary to blood loss (chronic): Secondary | ICD-10-CM

## 2017-04-07 HISTORY — DX: Iron deficiency anemia, unspecified: D50.9

## 2017-04-10 ENCOUNTER — Encounter: Payer: Self-pay | Admitting: Oncology

## 2017-04-10 NOTE — Progress Notes (Signed)
Hematology/Oncology Consult note [facility] [phone] [phone]  Patient Care Team: Dalia Heading, CNM as PCP - General (Certified Nurse Midwife)   Name of the patient: Christina Obrien  [id number]  03/21/1980    Reason for referral- iron deficiency anemia   Referring physician- Dr. Marius Ditch  Date of visit: 04/10/17   History of presenting illness-patient is a 37 year old female who was recently evaluated by GI for symptoms of nausea vomiting epigastric pain and diarrhea.  She also reported about 20-30 pound weight loss over the last 1 year.  She underwent EGD and colonoscopy in March 2019.  Upper endoscopy revealed nonspecific gastritis.  No evidence of H. pylori.  Colonoscopy revealed mild inflammation in the entire colon from rectum to cecum.  Terminal ileum was not examined.  Pathology showed mild active colitis including rectum.  She has been started on mesalamine for ulcerative colitis and referred to [place] for iron deficiency anemia.  She has tried oral iron in the past but it does cause a lot of constipation for her.  She reports that her fatigue and diarrhea is somewhat improved after she has been on medication.  She does not report any heavy  Menstrual cycles.  Recent CBC from 04/03/2017 showed white count of 7.5, H&H of 11.2/34.5 with an MCV of 78.3 and a platelet count of 263.Iron studies showed low serum iron of 18 and low iron saturation of 6%.  TIBC was normal at 315 ferritin was low at 8.  B12 levels were low normal at 249.  CRP was mildly elevated at 1.2.  ECOG PS- 0  Pain scale- 0   Review of systems- Review of Systems  Constitutional: Negative for chills, fever, malaise/fatigue and weight loss.  HENT: Negative for congestion, ear discharge and nosebleeds.   Eyes: Negative for blurred vision.  Respiratory: Negative for cough, hemoptysis, sputum production, shortness of breath and wheezing.   Cardiovascular: Negative for chest  pain, palpitations, orthopnea and claudication.  Gastrointestinal: Negative for abdominal pain, blood in stool, constipation, diarrhea, heartburn, melena, nausea and vomiting.  Genitourinary: Negative for dysuria, flank pain, frequency, hematuria and urgency.  Musculoskeletal: Negative for back pain, joint pain and myalgias.  Skin: Negative for rash.  Neurological: Negative for dizziness, tingling, focal weakness, seizures, weakness and headaches.  Endo/Heme/Allergies: Does not bruise/bleed easily.  Psychiatric/Behavioral: Negative for depression and suicidal ideas. The patient does not have insomnia.     No Known Allergies  Patient Active Problem List   Diagnosis Date Noted  . Iron deficiency anemia 04/07/2017  . Postprandial diarrhea 12/13/2016  . Lower abdominal pain 12/13/2016  . Weight loss 12/13/2016     Past Medical History:  Diagnosis Date  . Medical history non-contributory   . Previous cesarean section 2017   non reassuring FHT     Past Surgical History:  Procedure Laterality Date  . CESAREAN SECTION N/A 01/06/2016   Procedure: CESAREAN SECTION;  Surgeon: Malachy Mood, MD;  Location: [facility] ORS;  Service: Obstetrics;  Laterality: N/A;  . COLONOSCOPY WITH PROPOFOL N/A 12/27/2016   Procedure: COLONOSCOPY WITH PROPOFOL;  Surgeon: Jonathon Bellows, MD;  Location: [facility] ENDOSCOPY;  Service: Gastroenterology;  Laterality: N/A;  . ESOPHAGOGASTRODUODENOSCOPY (EGD) WITH PROPOFOL N/A 12/27/2016   Procedure: ESOPHAGOGASTRODUODENOSCOPY (EGD) WITH PROPOFOL;  Surgeon: Jonathon Bellows, MD;  Location: [facility] ENDOSCOPY;  Service: Gastroenterology;  Laterality: N/A;    Social History   Socioeconomic History  . Marital status: Single    Spouse name: Not on file  . Number of  children: 2  . Years of education: Not on file  . Highest education level: Not on file  Social Needs  . Financial resource strain: Not on file  . Food insecurity - worry: Not on file  . Food insecurity - inability:  Not on file  . Transportation needs - medical: Not on file  . Transportation needs - non-medical: Not on file  Occupational History  . Not on file  Tobacco Use  . Smoking status: Current Some Day Smoker    Types: Cigarettes    Last attempt to quit: 11/24/2016    Years since quitting: 0.3  . Smokeless tobacco: Never Used  . Tobacco comment: has not smoked in 1-2 weeks  Substance and Sexual Activity  . Alcohol use: Not on file  . Drug use: Not on file  . Sexual activity: Yes    Partners: Male    Birth control/protection: None    Comment: occasional condoms  Other Topics Concern  . Not on file  Social History Narrative  . Not on file     Family History  Problem Relation Age of Onset  . Diabetes Maternal Grandmother   . Liver cancer Maternal Grandfather   . Stroke Neg Hx   . Breast cancer Neg Hx   . Ovarian cancer Neg Hx      Current Outpatient Medications:  Marland Kitchen  Mesalamine (ASACOL) 400 MG CPDR DR capsule, Take 6 capsules (2,400 mg total) by mouth 2 (two) times daily., Disp: 360 capsule, Rfl: 2 .  Multiple Vitamin (MULTIVITAMIN) tablet, Take 1 tablet daily by mouth., Disp: , Rfl:    Physical exam:  Vitals:   04/07/17 1447  BP: 97/63  Pulse: 73  Resp: 14  Temp: 98.6 F (37 C)  TempSrc: Tympanic  SpO2: 99%  Weight: 125 lb (56.7 kg)  Height: 5' 4"  (1.626 m)   Physical Exam  Constitutional: She is oriented to person, place, and time.  Thin female in no acute distress  HENT:  Head: Normocephalic and atraumatic.  Eyes: EOM are normal. Pupils are equal, round, and reactive to light.  Neck: Normal range of motion.  Cardiovascular: Normal rate, regular rhythm and normal heart sounds.  Pulmonary/Chest: Effort normal and breath sounds normal.  Abdominal: Soft. Bowel sounds are normal.  Neurological: She is alert and oriented to person, place, and time.  Skin: Skin is warm and dry.       CMP Latest Ref Rng & Units 12/13/2016  Glucose 65 - 99 mg/dL 73  BUN 6 - 20  mg/dL 12  Creatinine 0.57 - 1.00 mg/dL 0.93  Sodium 134 - 144 mmol/L 141  Potassium 3.5 - 5.2 mmol/L 4.2  Chloride 96 - 106 mmol/L 103  CO2 20 - 29 mmol/L 24  Calcium 8.7 - 10.2 mg/dL 8.8  Total Protein 6.0 - 8.5 g/dL 7.3  Total Bilirubin 0.0 - 1.2 mg/dL 0.2  Alkaline Phos 39 - 117 IU/L 64  AST 0 - 40 IU/L 12  ALT 0 - 32 IU/L 6   CBC Latest Ref Rng & Units 04/03/2017  WBC 3.6 - 11.0 K/uL 7.5  Hemoglobin 12.0 - 16.0 g/dL 11.2(L)  Hematocrit 35.0 - 47.0 % 34.5(L)  Platelets 150 - 440 K/uL 263    Assessment and plan- Patient is a 37 y.o. female with newly diagnosed mild colitis referred for iron deficiency anemia  Given that recent labs done 3 days ago showed evidence of iron deficiency anemia, I will proceed with 2 doses of Feraheme 510  mg IV weekly.  Discussed risks and benefits of Feraheme including all but not limited to headaches, leg swelling and possible risk of infusion reactions.  Patient understands and agrees to proceed.  Her B12 levels were low normal at 249 and I have asked her to start taking B12 thousand micrograms p.o. daily.  I will see her back in 2 months time with repeat CBC ferritin and iron studies as well as B12 levels.   Thank you for this kind referral and the opportunity to participate in the care of this patient   Visit Diagnosis 1. Iron deficiency anemia due to chronic blood loss     Dr. Randa Evens, MD, MPH [facility] Pager- [phone] 04/10/2017  8:59 AM

## 2017-04-12 ENCOUNTER — Encounter: Payer: Self-pay | Admitting: Oncology

## 2017-04-12 ENCOUNTER — Inpatient Hospital Stay: Payer: 59

## 2017-04-12 VITALS — BP 105/64 | HR 94 | Temp 96.6°F | Resp 18

## 2017-04-12 DIAGNOSIS — D500 Iron deficiency anemia secondary to blood loss (chronic): Secondary | ICD-10-CM | POA: Diagnosis not present

## 2017-04-12 DIAGNOSIS — T7840XA Allergy, unspecified, initial encounter: Secondary | ICD-10-CM

## 2017-04-12 MED ORDER — SODIUM CHLORIDE 0.9 % IV SOLN
510.0000 mg | Freq: Once | INTRAVENOUS | Status: AC
  Administered 2017-04-12: 510 mg via INTRAVENOUS
  Filled 2017-04-12: qty 17

## 2017-04-12 MED ORDER — FAMOTIDINE IN NACL 20-0.9 MG/50ML-% IV SOLN
20.0000 mg | Freq: Two times a day (BID) | INTRAVENOUS | Status: DC
  Filled 2017-04-12: qty 50

## 2017-04-12 MED ORDER — SODIUM CHLORIDE 0.9 % IV SOLN
Freq: Once | INTRAVENOUS | Status: AC
  Administered 2017-04-12: 15:00:00 via INTRAVENOUS
  Filled 2017-04-12: qty 1000

## 2017-04-12 MED ORDER — DIPHENHYDRAMINE HCL 50 MG/ML IJ SOLN
25.0000 mg | Freq: Once | INTRAMUSCULAR | Status: AC
  Administered 2017-04-12: 25 mg via INTRAVENOUS

## 2017-04-12 MED ORDER — FAMOTIDINE IN NACL 20-0.9 MG/50ML-% IV SOLN
20.0000 mg | Freq: Once | INTRAVENOUS | Status: DC
  Administered 2017-04-12: 20 mg via INTRAVENOUS

## 2017-04-12 MED ORDER — METHYLPREDNISOLONE SODIUM SUCC 125 MG IJ SOLR
125.0000 mg | Freq: Once | INTRAMUSCULAR | Status: AC
Start: 2017-04-12 — End: 2017-04-12
  Administered 2017-04-12: 125 mg via INTRAVENOUS

## 2017-04-12 MED ORDER — SODIUM CHLORIDE 0.9 % IV SOLN
Freq: Once | INTRAVENOUS | Status: DC
  Filled 2017-04-12: qty 100

## 2017-04-12 MED ORDER — SODIUM CHLORIDE 0.9 % IV SOLN
Freq: Once | INTRAVENOUS | Status: AC
Start: 2017-04-12 — End: 2017-04-12
  Administered 2017-04-12: 14:00:00 via INTRAVENOUS
  Filled 2017-04-12: qty 1000

## 2017-04-12 NOTE — Progress Notes (Signed)
1435: Pt reports feeling "hot and sweaty" and dizzy. Feraheme stopped, NS started to gravity, Faythe Casa NP notified and reported to chairside. Medications given per protocol. VS remained stable.  1446: Pt reports symptoms resolved, Pt states she feels "woozy" at this time. Per Faythe Casa NP pt to be monitored for 30 minutes and okay to d/c home if pt remains in stable condition.

## 2017-04-12 NOTE — Progress Notes (Signed)
  Received a phone call from infusion that patient was reacting to first time dose IV Feraheme 510 mg. History of Anemia due to chronic blood loss.  She received approximately  75 mg of IV Feraheme prior to reaction.  Subjective symptoms include: Flushing, sweaty palms and dizziness. Objective symptoms include: Facial flushing  Vitals: Stable- On assessment blood pressure 135/72 (initial)           119/81 (Re-check)   Heart Rate: 92 (Initial)         70 (Re-check)   Patient given infusion reaction protocol including Pepcid 20 mg IV, Benadryl 25 mg IV and Solu-Medrol 125 mg IV.  Symptoms resolved almost immediately.   Patient's oxygen saturations remained stable during entire incident. Varied from 99-100% on room air. She never complained of shortness of breath.   Primary hematologist Dr. Janese Banks will be notified. Patient will keep scheduled appointment for next week. Likely we will not re challenge IV Fereheme given this reaction.   Patient will be monitored for at least 30 minutes after symptoms resolved. She will be given continuous fluids (Normal Saline) during this time. Vital signs will be checked prior to discharge from clinic. She was instructed to take 25 mg Benadryl Q8 and Pepcid Q12 for residual symptoms. She was instructed to seek medical help if symptoms worsen or she develops shortness of breath or swelling at home.  She denied any further questions at this time. Dr. Grayland Ormond consulted. He was in agreement with plan.  Faythe Casa, NP 04/12/2017 3:30 PM

## 2017-04-14 ENCOUNTER — Other Ambulatory Visit: Payer: Self-pay | Admitting: Oncology

## 2017-04-17 ENCOUNTER — Telehealth: Payer: Self-pay | Admitting: *Deleted

## 2017-04-17 NOTE — Telephone Encounter (Signed)
I called pt to let her know that Dr. Janese Banks was informed that pt had a reaction to feraheme and she has her next appt 3/27 and I have changed it per Dr. Janese Banks to venofer which is a different kind of iron and if she tolerates it we will need to make her 5 appt in total to get venofer and they will be in afternoon and pt agreeable to this and she would like it to stay on wed. I told her that I will wait to see how she does and then make future appts if the venofer goes ok.

## 2017-04-19 ENCOUNTER — Telehealth: Payer: Self-pay | Admitting: Oncology

## 2017-04-19 ENCOUNTER — Inpatient Hospital Stay: Payer: 59

## 2017-04-19 VITALS — BP 102/63 | HR 73 | Temp 97.8°F | Resp 20

## 2017-04-19 DIAGNOSIS — D500 Iron deficiency anemia secondary to blood loss (chronic): Secondary | ICD-10-CM

## 2017-04-19 MED ORDER — SODIUM CHLORIDE 0.9 % IV SOLN
Freq: Once | INTRAVENOUS | Status: AC
  Administered 2017-04-19: 14:00:00 via INTRAVENOUS
  Filled 2017-04-19: qty 1000

## 2017-04-19 MED ORDER — IRON SUCROSE 20 MG/ML IV SOLN
200.0000 mg | Freq: Once | INTRAVENOUS | Status: AC
  Administered 2017-04-19: 200 mg via INTRAVENOUS
  Filled 2017-04-19: qty 10

## 2017-04-19 NOTE — Telephone Encounter (Signed)
Venofer twice weekly x 2, per Sherry/ schd msg/verbal. Orig msg was weekly x 4, per patient request and Sherry's approval, now Venofer on Mon and Wed x 2 weeks.

## 2017-04-24 ENCOUNTER — Inpatient Hospital Stay: Payer: 59 | Attending: Oncology

## 2017-04-24 VITALS — BP 101/66 | HR 85 | Temp 95.7°F | Resp 18

## 2017-04-24 DIAGNOSIS — K5190 Ulcerative colitis, unspecified, without complications: Secondary | ICD-10-CM | POA: Insufficient documentation

## 2017-04-24 DIAGNOSIS — D500 Iron deficiency anemia secondary to blood loss (chronic): Secondary | ICD-10-CM | POA: Diagnosis present

## 2017-04-24 MED ORDER — IRON SUCROSE 20 MG/ML IV SOLN
200.0000 mg | Freq: Once | INTRAVENOUS | Status: AC
  Administered 2017-04-24: 200 mg via INTRAVENOUS
  Filled 2017-04-24: qty 10

## 2017-04-24 MED ORDER — SODIUM CHLORIDE 0.9 % IV SOLN
Freq: Once | INTRAVENOUS | Status: AC
  Administered 2017-04-24: 14:00:00 via INTRAVENOUS
  Filled 2017-04-24: qty 1000

## 2017-04-26 ENCOUNTER — Inpatient Hospital Stay: Payer: 59

## 2017-04-26 VITALS — BP 91/59 | HR 82 | Temp 98.6°F | Resp 16

## 2017-04-26 DIAGNOSIS — D500 Iron deficiency anemia secondary to blood loss (chronic): Secondary | ICD-10-CM | POA: Diagnosis not present

## 2017-04-26 MED ORDER — IRON SUCROSE 20 MG/ML IV SOLN
200.0000 mg | Freq: Once | INTRAVENOUS | Status: AC
  Administered 2017-04-26: 200 mg via INTRAVENOUS
  Filled 2017-04-26: qty 10

## 2017-04-26 MED ORDER — SODIUM CHLORIDE 0.9 % IV SOLN
Freq: Once | INTRAVENOUS | Status: AC
  Administered 2017-04-26: 14:00:00 via INTRAVENOUS
  Filled 2017-04-26: qty 1000

## 2017-05-01 ENCOUNTER — Inpatient Hospital Stay: Payer: 59

## 2017-05-01 VITALS — BP 92/57 | HR 85 | Temp 97.2°F | Resp 18

## 2017-05-01 DIAGNOSIS — D500 Iron deficiency anemia secondary to blood loss (chronic): Secondary | ICD-10-CM

## 2017-05-01 MED ORDER — SODIUM CHLORIDE 0.9 % IV SOLN
Freq: Once | INTRAVENOUS | Status: AC
  Administered 2017-05-01: 14:00:00 via INTRAVENOUS
  Filled 2017-05-01: qty 1000

## 2017-05-01 MED ORDER — IRON SUCROSE 20 MG/ML IV SOLN
200.0000 mg | Freq: Once | INTRAVENOUS | Status: AC
  Administered 2017-05-01: 200 mg via INTRAVENOUS
  Filled 2017-05-01: qty 10

## 2017-05-03 ENCOUNTER — Inpatient Hospital Stay: Payer: 59

## 2017-05-03 VITALS — BP 104/66 | HR 98 | Resp 20

## 2017-05-03 DIAGNOSIS — D500 Iron deficiency anemia secondary to blood loss (chronic): Secondary | ICD-10-CM | POA: Diagnosis not present

## 2017-05-03 MED ORDER — IRON SUCROSE 20 MG/ML IV SOLN
200.0000 mg | Freq: Once | INTRAVENOUS | Status: AC
  Administered 2017-05-03: 200 mg via INTRAVENOUS
  Filled 2017-05-03: qty 10

## 2017-06-05 ENCOUNTER — Encounter: Payer: Self-pay | Admitting: Gastroenterology

## 2017-06-05 ENCOUNTER — Ambulatory Visit: Payer: 59 | Admitting: Gastroenterology

## 2017-06-05 ENCOUNTER — Other Ambulatory Visit: Payer: Self-pay

## 2017-06-06 ENCOUNTER — Inpatient Hospital Stay: Payer: 59 | Admitting: Oncology

## 2017-06-06 ENCOUNTER — Inpatient Hospital Stay: Payer: 59

## 2017-10-30 ENCOUNTER — Encounter: Payer: Self-pay | Admitting: Gastroenterology

## 2017-10-30 ENCOUNTER — Ambulatory Visit: Payer: 59 | Admitting: Gastroenterology

## 2017-10-30 VITALS — BP 101/67 | HR 106 | Resp 17 | Ht 64.0 in | Wt 115.6 lb

## 2017-10-30 DIAGNOSIS — D500 Iron deficiency anemia secondary to blood loss (chronic): Secondary | ICD-10-CM | POA: Diagnosis not present

## 2017-10-30 DIAGNOSIS — D509 Iron deficiency anemia, unspecified: Secondary | ICD-10-CM | POA: Diagnosis not present

## 2017-10-30 DIAGNOSIS — K5100 Ulcerative (chronic) pancolitis without complications: Secondary | ICD-10-CM

## 2017-10-30 DIAGNOSIS — Z23 Encounter for immunization: Secondary | ICD-10-CM

## 2017-10-30 MED ORDER — PREDNISONE 10 MG PO TABS: 40.0000 mg | ORAL_TABLET | Freq: Every day | ORAL | 0 refills | Status: DC

## 2017-10-30 MED ORDER — AZATHIOPRINE 50 MG PO TABS: 50.0000 mg | ORAL_TABLET | Freq: Every day | ORAL | 0 refills | Status: AC

## 2017-10-30 NOTE — Patient Instructions (Signed)
1.  Start prednisone 40 mg daily, this will be a short-term medication 2.  Start azathioprine 50 mg daily, recheck labs in 2 weeks to monitor your hemoglobin, liver tests 3.  I will start paperwork for Humira, this will be a long-term medication if it works for your ulcerative colitis 4.  We will check labs today 5.  We will administer Twinrix vaccine, first dose today 6.  Follow-up in 2 weeks  Please call our office to speak with my nurse Barnie Alderman [phone] during business hours from 8am to 4pm if you have any questions/concerns. During after hours, you will be redirected to on call GI physician. For any emergency please call 911 or go the nearest emergency room.    [name], MD [address]  [place]  [place], [place] [address]  Main: [phone]  Fax: [phone]

## 2017-10-30 NOTE — Addendum Note (Signed)
Addended by: Earl Lagos on: 10/30/2017 04:38 PM   Modules accepted: Orders

## 2017-10-30 NOTE — Progress Notes (Signed)
[name], MD [address]  [place]  [place], [place] [address]  Main: [phone]  Fax: [phone]    Gastroenterology Consultation  Referring Provider:     Dalia Heading, CNM Primary Care Physician:  Dalia Heading, CNM Primary Gastroenterologist:  Dr. [name] Reason for Consultation:   Pan ulcerative colitis        HPI:   Christina Obrien is a 37 y.o. female referred by [facility]  for consultation & management of chronic diarrhea and weight loss.   Onset of diarrhea and weight loss since early part of summer Nonbloody diarrhea, post prandial, 1mn after eating, lower abdominal cramps, gassy.  She denies nocturnal diarrhea, urgency or incontinence.  She denies epigastric pain, nausea, vomiting.  She used to have regular bowel movements about 1 year ago. Had delivered healthy baby in dec 2017, underwent C-section, she started loosing weight since march/april. She had intermittent loose stools during pregnancy also.  She denies travel outside [place] camping or using antibiotics, NSAIDs or sick contacts She has h/o anemia, first detected in 12/2015 and recently found to have microcytic anemia.  She is currently taking women's multivitamin daily She was also losing weight before pregnancy. Baseline weight is 150-160s.  Today she weighs about 126 pounds She reports that she was tested for HIV at the time of pregnancy and it was negative, TSH was normal, CMP was normal Used to smoke cigarettes regularly 1pack last for 2-3days since early 20s Stopped in pregnancy Restarted after delivery, cut it back since onset of symptoms as she felt smoking made her symptoms worse She has not tried any medications for diarrhea or abdominal cramps  Follow-up visit 04/03/2017: Patient underwent upper endoscopy and colonoscopy. Upper endoscopy revealed nonspecific gastritis. There was no evidence of H. Pylori. Colonoscopy revealed mild inflammation in the  entire colon from rectum to cecum. Terminal ileum was not examined. Pathology revealed mild active colitis including rectum. Patient is here for follow-up visit today. She continues to feel weak, lost about 4 pounds Her GI symptoms remain unchanged. She is taking women's multivitamin and alfa-alfa supplements daily. She reports not taking NSAIDs since the GI procedures as Dr. AVicente Malesrecommended her to stop NSAIDs and thought her inflammation could be NSAID related injury. She continues to smoke, 1 pack last for 2 days  Follow-up visit 10/30/2017: She was taking mesalamine 2.4 g daily for few weeks, she felt better.  Then, she stopped taking mesalamine by herself because she thought it was not completely alleviating her symptoms.  She now presents for follow-up with worsening of symptoms including abdominal pain, nausea, vomiting, postprandial diarrhea, weight loss.  She did not follow up with me in last 7 months.  She thinks something else is going on leading to weight loss other than ulcerative colitis.  She smokes about 2 cigarettes/day.  She said she has been busy with job and kids and did not pay much attention to herself.  She did not follow-up with hematology for iron deficiency anemia.  She states she received for iron infusions since 03/2017.  She denies fever, chills.  She does see intermittent blood in stools.  NSAIDs: she used to take ibuprofen about once a week or less for occasional headaches She denies taking any over-the-counter supplements other than multivitamin Antiplts/Anticoagulants/Anti thrombotics: none  GI Procedures:  EGD and colonoscopy on 12/27/2016 by Dr. AVicente MalesThe esophagus was normal. Patchy moderate inflammation characterized by adherent blood, congestion (edema), erosions and erythema was  found in the gastric antrum. Biopsies were taken with a cold forceps for histology. Two non-bleeding superficial gastric ulcers with no stigmata of bleeding were found in the stomach. The  largest lesion was 4 mm in largest dimension. Biopsies were taken with a cold forceps for histology. Duodenum was inflamed  Colonoscopy - Preparation of the colon was poor. - Patchy moderate inflammation was found in the entire examined colon secondary to colitis. Biopsied. - One 6 mm polyp in the ascending colon, removed with a cold biopsy forceps. Resected and retrieved. - The examination was otherwise normal on direct and retroflexion views. DIAGNOSIS:  A. 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  - NEGATIVE FOR ACTIVE INFLAMMATION, INTRAEPITHELIAL LYMPHOCYTOSIS, AND  INFECTIOUS AGENTS.   B. DUODENUM, THIRD PORTION; COLD BIOPSY:  - DUODENAL MUCOSA WITH INTACT VILLI.  - NEGATIVE FOR ACTIVE INFLAMMATION, INTRAEPITHELIAL LYMPHOCYTOSIS, AND  INFECTIOUS AGENTS.   C. STOMACH ADJACENT TO ULCER; COLD BIOPSY:  - ANTRAL-TYPE MUCOSA WITH MILD CHRONIC GASTRITIS, SEE COMMENT.  - REACTIVE FOVEOLAR HYPERPLASIA.  - NEGATIVE FOR HELICOBACTER PYLORI BY IMMUNOHISTOCHEMISTRY (IHC).   Comment:  There are a few foci of gastric glandular injury surrounded by  lymphocytes, eosinophils, and macrophages. Neutrophils are not  identified. This focally enhanced gastritis pattern is not specific in  adults but can be a medication effect, and can be seen in inflammatory  bowel disease. No granulomas or viral inclusions are seen. One tiny  crushed mucosal fragment shows a few goblet cells but I cannot be sure  of the type of tissue. Control for H pylori IHC stained appropriately.   D. RIGHT COLON; RANDOM COLD BIOPSY:  - MILD ACTIVE COLITIS WITH FEATURES OF CHRONICITY.   E. COLON POLYP, ASCENDING; COLD BIOPSY:  - CHRONIC ULCER IN A BACKGROUND OF ACTIVE COLITIS, SEE COMMENT.   F. TRANSVERSE COLON; RANDOM COLD BIOPSY:  - MILD ACTIVE COLITIS WITH FEATURES OF CHRONICITY.   G. SIGMOID COLON; RANDOM COLD BIOPSY:  - MILD ACTIVE COLITIS WITH FEATURES OF CHRONICITY.     H. RECTUM; RANDOM COLD BIOPSY:  - MILD ACTIVE PROCTITIS WITH FEATURES OF CHRONICITY.   Comment:  All of the samples are negative for dysplasia and malignancy.   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 Medical history non-contributory   . Previous cesarean section 2017   non reassuring FHT    Past Surgical History:  Procedure Laterality Date  . CESAREAN SECTION N/A 01/06/2016   Procedure: CESAREAN SECTION;  Surgeon: Malachy Mood, MD;  Location: [facility] ORS;  Service: Obstetrics;  Laterality: N/A;  . COLONOSCOPY WITH PROPOFOL N/A 12/27/2016   Procedure: COLONOSCOPY WITH PROPOFOL;  Surgeon: Jonathon Bellows, MD;  [facility]COPY;  Service: Gastroenterology;  Laterality: N/A;  . ESOPHAGOGASTRODUODENOSCOPY (EGD) WITH PROPOFOL N/A 12/27/2016   Procedure: ESOPHAGOGASTRODUODENOSCOPY (EGD) WITH PROPOFOL;  Surgeon: Jonathon Bellows, MD;  Location: [facility] ENDOSCOPY;  Service: Gastroenterology;  Laterality: N/A;    Current Outpatient Medications:  Marland Kitchen  Multiple Vitamin (MULTIVITAMIN) tablet, Take 1 tablet daily by mouth., Disp: , Rfl:  .  azaTHIOprine (IMURAN) 50 MG tablet, Take 1 tablet (50 mg total) by mouth daily., Disp: 30 tablet, Rfl: 0 .  LIALDA 1.2 g EC tablet, , Disp: , Rfl:  .  predniSONE (DELTASONE) 10 MG tablet, Take 4 tablets (  40 mg total) by mouth daily with breakfast for 28 days., Disp: 112 tablet, Rfl: 0    Family History  Problem Relation Age of Onset  . Diabetes Maternal Grandmother   . Liver cancer Maternal Grandfather   . Stroke Neg Hx   . Breast cancer Neg Hx   . Ovarian cancer Neg Hx      Social History   Tobacco Use  . Smoking status: Current Some Day Smoker    Types: Cigarettes    Last attempt to quit: 11/24/2016    Years since quitting: 0.9  .  Smokeless tobacco: Never Used  . Tobacco comment: has not smoked in 1-2 weeks  Substance Use Topics  . Alcohol use: Not on file  . Drug use: Not on file    Allergies as of 10/30/2017  . (No Known Allergies)    Review of Systems:    All systems reviewed and negative except where noted in HPI.   Physical Exam:  BP 101/67 (BP Location: Left Arm, Patient Position: Sitting, Cuff Size: Normal)   Pulse (!) 106   Resp 17   Ht _0  (1.626 m)   Wt 115 lb 9.6 oz (52.4 kg)   BMI 19.84 kg/m  No LMP recorded.  General:   Alert, thin built, pleasant and cooperative in NAD Head:  Normocephalic and atraumatic. Eyes:  Sclera clear, no icterus.   Conjunctiva pink. Ears:  Normal auditory acuity. Nose:  No deformity, discharge, or lesions. Mouth:  No deformity or lesions,oropharynx pink & moist. Neck:  Supple; no masses or thyromegaly. Lungs:  Respirations even and unlabored.  Clear throughout to auscultation.   No wheezes, crackles, or rhonchi. No acute distress. Heart:  Regular rate and rhythm; no murmurs, clicks, rubs, or gallops. Abdomen:  Normal bowel sounds. Soft, mild diffuse tenderness and non-distended without masses, hepatosplenomegaly or hernias noted.  No guarding or rebound tenderness.   Rectal: Nor performed Msk:  Symmetrical without gross deformities. Good, equal movement & strength bilaterally. Pulses:  Normal pulses noted. Extremities:  No clubbing or edema.  No cyanosis. Neurologic:  Alert and oriented x3;  grossly normal neurologically. Skin:  Intact without significant lesions or rashes. No jaundice. Psych:  Alert and cooperative. Normal mood and affect.  Imaging Studies: None  Assessment and Plan:   Christina Obrien is a 37 y.o. African-American female is here follow-up ulcerative pancolitis.  She has significantly elevated fecal calprotectin 475, ESR 72. Her stool studies were negative for infection. Colonoscopy revealed inflammation in the entire colon including  rectum,pathology showed mild chronic active colitis in all the segments of colon, there was no evidence of dysplasia. 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  Ulcerative colitis: extensive, mild with progression of symptoms on mesalamine -Start prednisone 40 mg daily for 2 to 3 weeks then start taper -Start azathioprine 50 mg daily, recheck CBC and LFTs in 2 weeks, 4 weeks then every 3 months TPMT levels are normal -Discussed with patient about initiation of anti-TNF therapy or anti-Integrin therapy.  Patient chose to start Humira over vedolizumab.  I discussed with her about risks and benefits of Humira including but not limited to injection site reaction, allergic reaction, immunosuppression, increased risk for respiratory tract infection, small risk for skin cancer, lymphoma.  Patient is agreeable to start the medication.  Will start the paperwork and get approval from her insurance company  IBD Health Maintenance  1.TB status: gold quantiferon negative   2. Anemia: Severe iron  deficiency anemia, status post parenteral iron therapy, recheck CBC, iron studies, B12 today 3.Immunizations: Hep A and B negative serologies, recommend Twinrix vaccine, Influenza recommend, prevnar recommed, pneumovax recommend in 3-6 months after prevanr, Varicella-had chickenpox as a child, Zoster at age 29 4.Cancer screening I) Colon cancer/dysplasia surveillance:  Colonoscopy in 12/2016, no dysplasia II) Cervical cancer: Annual Pap smear negative to date III) Skin cancer - counseled about annual skin exam by dermatology and skin protection in summer using sun screen SPF > 50, clothing 5.Bone health Vitamin D status:  Check today Bone density testing:  Not done 5. Labs:  today 6. Smoking: quit smoking 7. NSAIDs and Antibiotics use: continue to avoid   Follow up in 2 weeks    [name], MD

## 2017-10-31 LAB — COMPREHENSIVE METABOLIC PANEL
ALT: 9 IU/L (ref 0–32)
AST: 10 IU/L (ref 0–40)
Albumin/Globulin Ratio: 1.1 — ABNORMAL LOW (ref 1.2–2.2)
Albumin: 4 g/dL (ref 3.5–5.5)
Alkaline Phosphatase: 77 IU/L (ref 39–117)
BUN/Creatinine Ratio: 8 — ABNORMAL LOW (ref 9–23)
BUN: 7 mg/dL (ref 6–20)
Bilirubin Total: 0.5 mg/dL (ref 0.0–1.2)
CO2: 21 mmol/L (ref 20–29)
Calcium: 8.8 mg/dL (ref 8.7–10.2)
Chloride: 97 mmol/L (ref 96–106)
Creatinine, Ser: 0.86 mg/dL (ref 0.57–1.00)
GFR calc Af Amer: 98 mL/min/{1.73_m2} (ref >59)
GFR calc non Af Amer: 85 mL/min/{1.73_m2} (ref >59)
Globulin, Total: 3.8 g/dL (ref 1.5–4.5)
Glucose: 79 mg/dL (ref 65–99)
Potassium: 3 mmol/L — ABNORMAL LOW (ref 3.5–5.2)
Sodium: 137 mmol/L (ref 134–144)
Total Protein: 7.8 g/dL (ref 6.0–8.5)

## 2017-10-31 LAB — IRON AND TIBC
IRON SATURATION: 7 % — AB (ref 15–55)
Iron: 13 ug/dL — ABNORMAL LOW (ref 27–159)
TIBC: 183 ug/dL — AB (ref 250–450)
UIBC: 170 ug/dL (ref 131–425)

## 2017-10-31 LAB — CBC
Hematocrit: 37.6 % (ref 34.0–46.6)
Hemoglobin: 12.6 g/dL (ref 11.1–15.9)
MCH: 27.9 pg (ref 26.6–33.0)
MCHC: 33.5 g/dL (ref 31.5–35.7)
MCV: 83 fL (ref 79–97)
PLATELETS: 290 10*3/uL (ref 150–450)
RBC: 4.51 x10E6/uL (ref 3.77–5.28)
RDW: 12.8 % (ref 12.3–15.4)
WBC: 10.3 10*3/uL (ref 3.4–10.8)

## 2017-10-31 LAB — VITAMIN D 25 HYDROXY (VIT D DEFICIENCY, FRACTURES): Vit D, 25-Hydroxy: 17 ng/mL — ABNORMAL LOW (ref 30.0–100.0)

## 2017-10-31 LAB — VITAMIN B12: Vitamin B-12: 1992 pg/mL — ABNORMAL HIGH (ref 232–1245)

## 2017-10-31 LAB — C-REACTIVE PROTEIN: CRP: 103 mg/L — ABNORMAL HIGH (ref 0–10)

## 2017-10-31 LAB — FERRITIN: Ferritin: 189 ng/mL — ABNORMAL HIGH (ref 15–150)

## 2017-11-13 ENCOUNTER — Ambulatory Visit: Payer: 59 | Admitting: Gastroenterology

## 2017-11-27 ENCOUNTER — Encounter: Payer: Self-pay | Admitting: Gastroenterology

## 2017-11-27 ENCOUNTER — Ambulatory Visit: Payer: 59 | Admitting: Gastroenterology

## 2017-11-27 VITALS — BP 110/71 | HR 67 | Resp 16 | Ht 64.0 in | Wt 133.4 lb

## 2017-11-27 DIAGNOSIS — Z23 Encounter for immunization: Secondary | ICD-10-CM | POA: Diagnosis not present

## 2017-11-27 DIAGNOSIS — K5100 Ulcerative (chronic) pancolitis without complications: Secondary | ICD-10-CM | POA: Diagnosis not present

## 2017-11-27 DIAGNOSIS — K5190 Ulcerative colitis, unspecified, without complications: Secondary | ICD-10-CM | POA: Insufficient documentation

## 2017-11-27 MED ORDER — PREDNISONE 10 MG PO TABS: 20.0000 mg | ORAL_TABLET | Freq: Every day | ORAL | 0 refills | Status: AC

## 2017-11-27 NOTE — Progress Notes (Signed)
[name], MD [address]  [place]  [place], [place] [address]  Main: [phone]  Fax: [phone]    Gastroenterology Consultation  Referring Provider:     Dalia Heading, CNM Primary Care Physician:  Dalia Heading, CNM Primary Gastroenterologist:  Dr. [name] Reason for Consultation:   Panulcerative colitis        HPI:   Kissie Ziolkowski is a 37 y.o. female referred by [facility]  for consultation & management of chronic diarrhea and weight loss.   Onset of diarrhea and weight loss since early part of summer Nonbloody diarrhea, post prandial, 3mn after eating, lower abdominal cramps, gassy.  She denies nocturnal diarrhea, urgency or incontinence.  She denies epigastric pain, nausea, vomiting.  She used to have regular bowel movements about 1 year ago. Had delivered healthy baby in dec 2017, underwent C-section, she started loosing weight since march/april. She had intermittent loose stools during pregnancy also.  She denies travel outside [place] camping or using antibiotics, NSAIDs or sick contacts She has h/o anemia, first detected in 12/2015 and recently found to have microcytic anemia.  She is currently taking women's multivitamin daily She was also losing weight before pregnancy. Baseline weight is 150-160s.  Today she weighs about 126 pounds She reports that she was tested for HIV at the time of pregnancy and it was negative, TSH was normal, CMP was normal Used to smoke cigarettes regularly 1pack last for 2-3days since early 20s Stopped in pregnancy Restarted after delivery, cut it back since onset of symptoms as she felt smoking made her symptoms worse She has not tried any medications for diarrhea or abdominal cramps  Follow-up visit 04/03/2017: Patient underwent upper endoscopy and colonoscopy. Upper endoscopy revealed nonspecific gastritis. There was no evidence of H. Pylori. Colonoscopy revealed mild inflammation in the  entire colon from rectum to cecum. Terminal ileum was not examined. Pathology revealed mild active colitis including rectum. Patient is here for follow-up visit today. She continues to feel weak, lost about 4 pounds Her GI symptoms remain unchanged. She is taking women's multivitamin and alfa-alfa supplements daily. She reports not taking NSAIDs since the GI procedures as Dr. AVicente Malesrecommended her to stop NSAIDs and thought her inflammation could be NSAID related injury. She continues to smoke, 1 pack last for 2 days  Follow-up visit 10/30/2017: She was taking mesalamine 2.4 g daily for few weeks, she felt better.  Then, she stopped taking mesalamine by herself because she thought it was not completely alleviating her symptoms.  She now presents for follow-up with worsening of symptoms including abdominal pain, nausea, vomiting, postprandial diarrhea, weight loss.  She did not follow up with me in last 7 months.  She thinks something else is going on leading to weight loss other than ulcerative colitis.  She smokes about 2 cigarettes/day.  She said she has been busy with job and kids and did not pay much attention to herself.  She did not follow-up with hematology for iron deficiency anemia.  She states she received for iron infusions since 03/2017.  She denies fever, chills.  She does see intermittent blood in stools.  Follow up visit 11/27/17: She reports feeling fairly well today.  Her GI symptoms have resolved.  Patient started Remicade in 10/2017, received first induction dose.  Scheduled to receive second induction dose today.  She is currently taking prednisone 40 mg daily.  Patient gained 15 pounds since last visit  NSAIDs: she used to take  ibuprofen about once a week or less for occasional headaches She denies taking any over-the-counter supplements other than multivitamin Antiplts/Anticoagulants/Anti thrombotics: none  GI Procedures:  EGD and colonoscopy on 12/27/2016 by Dr. Vicente Males The esophagus  was normal. Patchy moderate inflammation characterized by adherent blood, congestion (edema), erosions and erythema was found in the gastric antrum. Biopsies were taken with a cold forceps for histology. Two non-bleeding superficial gastric ulcers with no stigmata of bleeding were found in the stomach. The largest lesion was 4 mm in largest dimension. Biopsies were taken with a cold forceps for histology. Duodenum was inflamed  Colonoscopy - Preparation of the colon was poor. - Patchy moderate inflammation was found in the entire examined colon secondary to colitis. Biopsied. - One 6 mm polyp in the ascending colon, removed with a cold biopsy forceps. Resected and retrieved. - The examination was otherwise normal on direct and retroflexion views. DIAGNOSIS:  A. 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  - NEGATIVE FOR ACTIVE INFLAMMATION, INTRAEPITHELIAL LYMPHOCYTOSIS, AND  INFECTIOUS AGENTS.   B. DUODENUM, THIRD PORTION; COLD BIOPSY:  - DUODENAL MUCOSA WITH INTACT VILLI.  - NEGATIVE FOR ACTIVE INFLAMMATION, INTRAEPITHELIAL LYMPHOCYTOSIS, AND  INFECTIOUS AGENTS.   C. STOMACH ADJACENT TO ULCER; COLD BIOPSY:  - ANTRAL-TYPE MUCOSA WITH MILD CHRONIC GASTRITIS, SEE COMMENT.  - REACTIVE FOVEOLAR HYPERPLASIA.  - NEGATIVE FOR HELICOBACTER PYLORI BY IMMUNOHISTOCHEMISTRY (IHC).   Comment:  There are a few foci of gastric glandular injury surrounded by  lymphocytes, eosinophils, and macrophages. Neutrophils are not  identified. This focally enhanced gastritis pattern is not specific in  adults but can be a medication effect, and can be seen in inflammatory  bowel disease. No granulomas or viral inclusions are seen. One tiny  crushed mucosal fragment shows a few goblet cells but I cannot be sure  of the type of tissue. Control for H pylori IHC stained appropriately.   D. RIGHT COLON; RANDOM COLD BIOPSY:  - MILD ACTIVE COLITIS WITH FEATURES OF  CHRONICITY.   E. COLON POLYP, ASCENDING; COLD BIOPSY:  - CHRONIC ULCER IN A BACKGROUND OF ACTIVE COLITIS, SEE COMMENT.   F. TRANSVERSE COLON; RANDOM COLD BIOPSY:  - MILD ACTIVE COLITIS WITH FEATURES OF CHRONICITY.   G. SIGMOID COLON; RANDOM COLD BIOPSY:  - MILD ACTIVE COLITIS WITH FEATURES OF CHRONICITY.   H. RECTUM; RANDOM COLD BIOPSY:  - MILD ACTIVE PROCTITIS WITH FEATURES OF CHRONICITY.   Comment:  All of the samples are negative for dysplasia and malignancy.   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 Medical history non-contributory   . Previous cesarean section 2017   non reassuring FHT    Past Surgical History:  Procedure Laterality Date  . CESAREAN SECTION N/A 01/06/2016   Procedure: CESAREAN SECTION;  Surgeon: Malachy Mood, MD;  Location: [facility] ORS;  Service: Obstetrics;  Laterality: N/A;  . COLONOSCOPY WITH PROPOFOL N/A 12/27/2016   Procedure: COLONOSCOPY WITH PROPOFOL;  Surgeon: Jonathon Bellows, MD;  Location: [facility] ENDOSCOPY;  Service: Gastroenterology;  Laterality: N/A;  . ESOPHAGOGASTRODUODENOSCOPY (EGD) WITH PROPOFOL N/A 12/27/2016   Procedure: ESOPHAGOGASTRODUODENOSCOPY (EGD) WITH PROPOFOL;  Surgeon: Jonathon Bellows, MD;  Location: [facility] ENDOSCOPY;  Service: Gastroenterology;  Laterality: N/A;    Current Outpatient Medications:  .  azaTHIOprine (IMURAN) 50 MG tablet, Take 1  tablet (50 mg total) by mouth daily., Disp: 30 tablet, Rfl: 0 .  LIALDA 1.2 g EC tablet, , Disp: , Rfl:  .  Multiple Vitamin (MULTIVITAMIN) tablet, Take 1 tablet daily by mouth., Disp: , Rfl:  .  predniSONE (DELTASONE) 10 MG tablet, Take 2 tablets (20 mg total) by mouth daily with breakfast for 28 doses. Take 87m for 1week, then 1343mfor 1week, then 43m20m week, Disp: 28 tablet, Rfl:  0    Family History  Problem Relation Age of Onset  . Diabetes Maternal Grandmother   . Liver cancer Maternal Grandfather   . Stroke Neg Hx   . Breast cancer Neg Hx   . Ovarian cancer Neg Hx      Social History   Tobacco Use  . Smoking status: Current Some Day Smoker    Types: Cigarettes    Last attempt to quit: 11/24/2016    Years since quitting: 1.0  . Smokeless tobacco: Never Used  . Tobacco comment: has not smoked in 1-2 weeks  Substance Use Topics  . Alcohol use: Not on file  . Drug use: Not on file    Allergies as of 11/27/2017  . (No Known Allergies)    Review of Systems:    All systems reviewed and negative except where noted in HPI.   Physical Exam:  BP 110/71 (BP Location: Left Arm, Patient Position: Sitting, Cuff Size: Normal)   Pulse 67   Resp 16   Ht _0  (1.626 m)   Wt 133 lb 6.4 oz (60.5 kg)   BMI 22.90 kg/m  No LMP recorded.  General:   Alert, well-built, pleasant and cooperative in NAD Head:  Normocephalic and atraumatic. Eyes:  Sclera clear, no icterus.   Conjunctiva pink. Ears:  Normal auditory acuity. Nose:  No deformity, discharge, or lesions. Mouth:  No deformity or lesions,oropharynx pink & moist. Neck:  Supple; no masses or thyromegaly. Lungs:  Respirations even and unlabored.  Clear throughout to auscultation.   No wheezes, crackles, or rhonchi. No acute distress. Heart:  Regular rate and rhythm; no murmurs, clicks, rubs, or gallops. Abdomen:  Normal bowel sounds. Soft, mild diffuse tenderness and non-distended without masses, hepatosplenomegaly or hernias noted.  No guarding or rebound tenderness.   Rectal: Nor performed Msk:  Symmetrical without gross deformities. Good, equal movement & strength bilaterally. Pulses:  Normal pulses noted. Extremities:  No clubbing or edema.  No cyanosis. Neurologic:  Alert and oriented x3;  grossly normal neurologically. Skin:  Intact without significant lesions or rashes. No jaundice. Psych:   Alert and cooperative. Normal mood and affect.  Imaging Studies: None  Assessment and Plan:   LeaJohnanna Bakke a 37 87o. African-American female is here follow-up ulcerative pancolitis.  She has significantly elevated fecal calprotectin 475, ESR 72. Her stool studies were negative for infection. Colonoscopy revealed inflammation in the entire colon including rectum,pathology showed mild chronic active colitis in all the segments of colon, there was no evidence of dysplasia. 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  Ulcerative colitis: extensive, mild with progression of symptoms on mesalamine -Start prednisone taper, decrease to 20 mg daily for 1 week then 10 mg for 1 week then 5 mg for 1 week then stop.  -Continue azathioprine 50 mg daily, recheck CBC and LFTs today then every 3 months TPMT levels are normal -Continue Remicade at the standard dose  IBD Health Maintenance  1.TB status: gold quantiferon negative   2. Anemia:  Severe iron deficiency anemia, status post parenteral iron therapy, anemia resolved.  Ferritin and B12 levels restored 3.Immunizations: Hep A and B negative serologies, received Twinrix vaccine 2 doses so far, Influenza recommend, prevnar received in 10/2017, pneumovax recommend in 3-6 months after prevanr, Varicella-had chickenpox as a child, Zoster at age 83 4.Cancer screening I) Colon cancer/dysplasia surveillance:  Colonoscopy in 12/2016, no dysplasia II) Cervical cancer: Annual Pap smear negative to date III) Skin cancer - counseled about annual skin exam by dermatology and skin protection in summer using sun screen SPF > 50, clothing 5.Bone health Vitamin D status:  Check today Bone density testing:  Not done 5. Labs:  today 6. Smoking: quit smoking 7. NSAIDs and Antibiotics use: continue to avoid   Follow up in 3 months    [name], MD

## 2017-11-28 LAB — CBC
HEMATOCRIT: 36.2 % (ref 34.0–46.6)
Hemoglobin: 11.9 g/dL (ref 11.1–15.9)
MCH: 28.3 pg (ref 26.6–33.0)
MCHC: 32.9 g/dL (ref 31.5–35.7)
MCV: 86 fL (ref 79–97)
Platelets: 248 10*3/uL (ref 150–450)
RBC: 4.21 x10E6/uL (ref 3.77–5.28)
RDW: 14.1 % (ref 12.3–15.4)
WBC: 7.6 10*3/uL (ref 3.4–10.8)

## 2017-11-28 LAB — HEPATIC FUNCTION PANEL
ALBUMIN: 4 g/dL (ref 3.5–5.5)
ALT: 14 IU/L (ref 0–32)
AST: 14 IU/L (ref 0–40)
Alkaline Phosphatase: 56 IU/L (ref 39–117)
BILIRUBIN, DIRECT: 0.06 mg/dL (ref 0.00–0.40)
Bilirubin Total: <0.2 mg/dL (ref 0.0–1.2)
Total Protein: 7 g/dL (ref 6.0–8.5)

## 2017-11-28 LAB — C-REACTIVE PROTEIN: CRP: 2 mg/L (ref 0–10)

## 2017-12-27 IMAGING — DX DG ABDOMEN 1V
1 series · 1 of 1 positions shown · non-contrast
Comparison: None

CLINICAL DATA: Post Caesarean section, surgical instruments, has
pain pump

EXAM:
ABDOMEN - 1 VIEW

[abdomen kub]
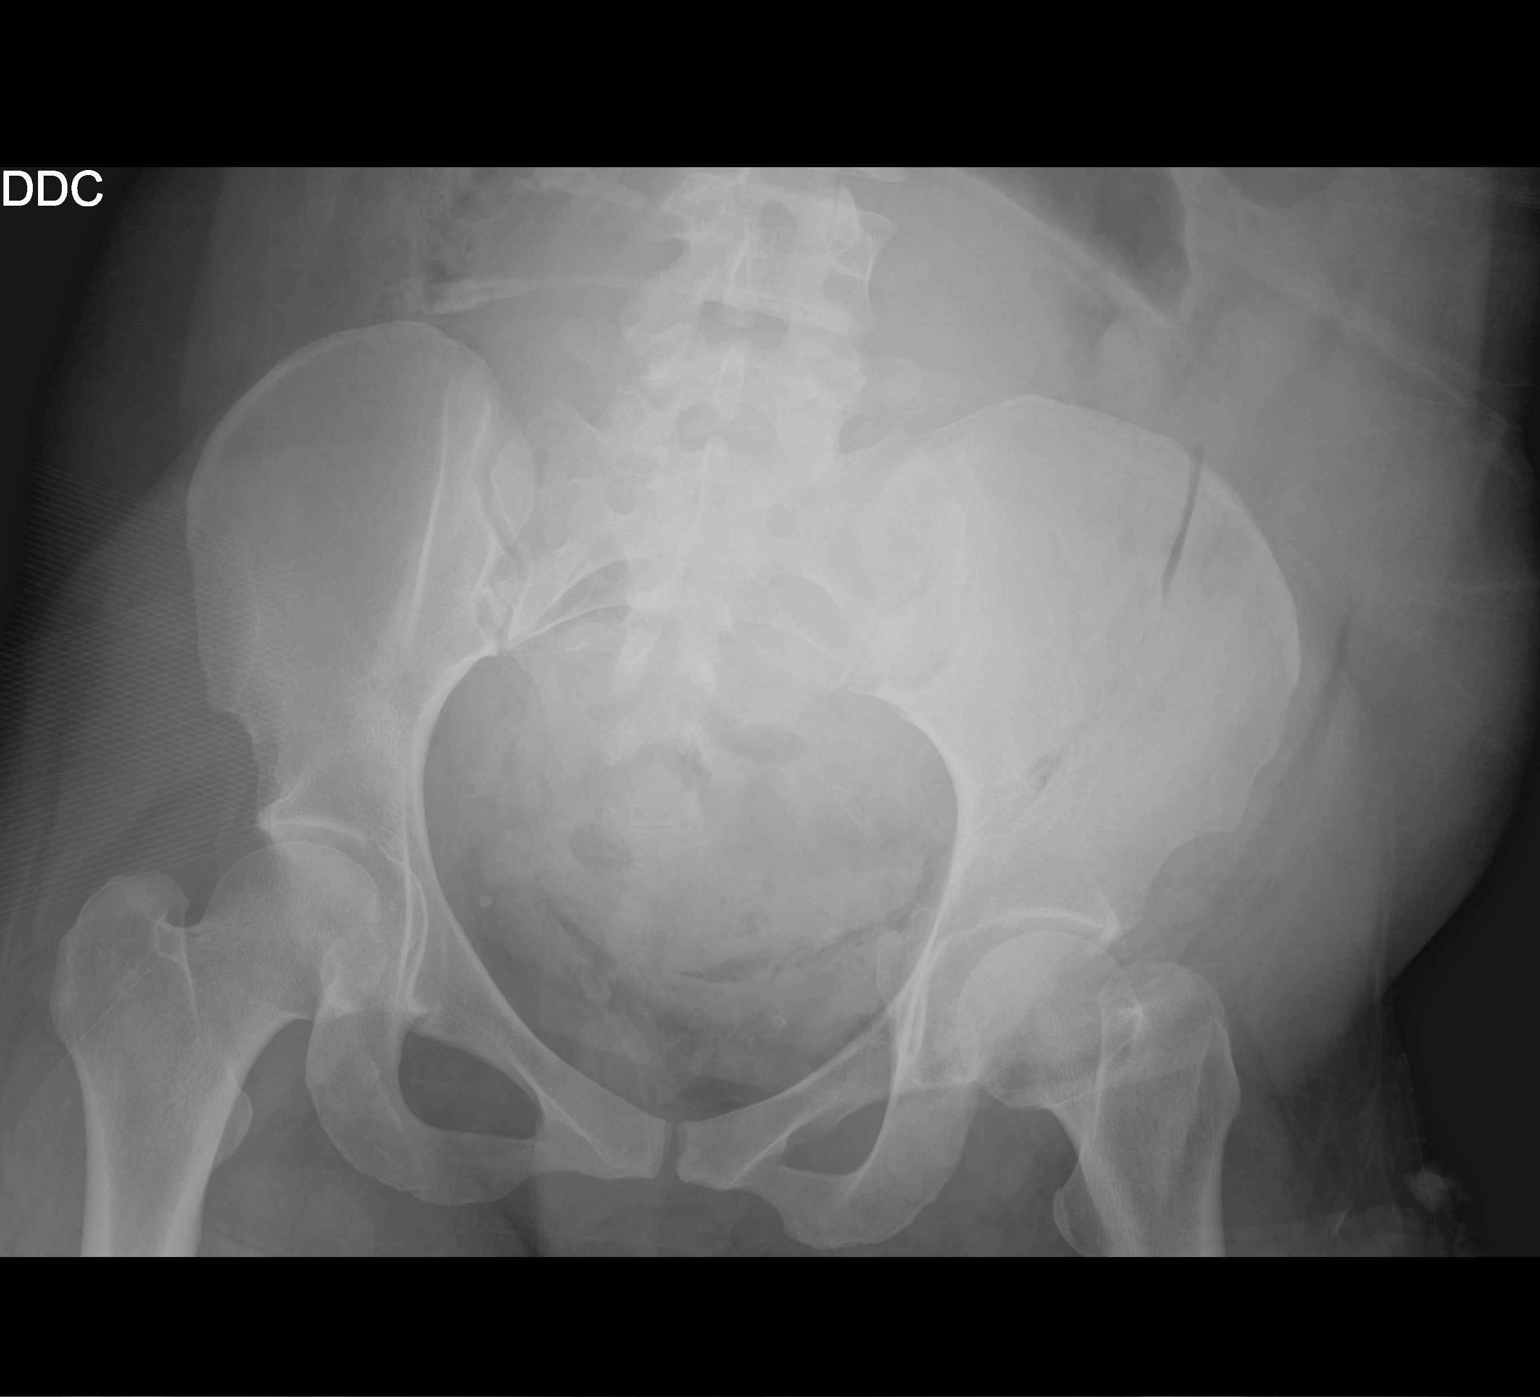

[1 of 1 positions shown; findings below may reference images not displayed]

FINDINGS: Soft tissue density in the pelvis and extending into the mid abdomen
is compatible with a gravid uterus.

Small amounts of gas are seen within soft tissues in the pelvis
consistent with preceding Caesarean section.

Visualized bowel gas pattern normal.

Small pelvic phleboliths.

Catheter tubing projects over the abdomen and pelvis from patient's
pump.

Additional somewhat linear appearing opacity projects over the LEFT
pelvis, could potentially represent additional tubing from patient's
pain pump.

No other radiopaque foreign bodies are definitely visualized.

No acute osseous findings.
IMPRESSION: Tubing projects over pelvis in mid abdomen with an additional
potential radiopacity projecting over the LEFT pelvis, somewhat
linear, question additional tubing related to patient's pain pump.

No other radiopaque foreign bodies identified.

Findings called to labor and delivery on 01/06/2016 at 3392 hours.

## 2018-02-26 ENCOUNTER — Ambulatory Visit: Payer: 59 | Admitting: Gastroenterology

## 2018-03-29 ENCOUNTER — Encounter: Payer: Self-pay | Admitting: Gastroenterology

## 2018-03-29 ENCOUNTER — Other Ambulatory Visit: Payer: Self-pay

## 2018-03-29 ENCOUNTER — Ambulatory Visit: Payer: 59 | Admitting: Gastroenterology

## 2018-03-29 VITALS — BP 121/78 | HR 92 | Resp 17 | Ht 64.0 in | Wt 147.4 lb

## 2018-03-29 DIAGNOSIS — E559 Vitamin D deficiency, unspecified: Secondary | ICD-10-CM

## 2018-03-29 DIAGNOSIS — K5100 Ulcerative (chronic) pancolitis without complications: Secondary | ICD-10-CM

## 2018-03-29 MED ORDER — VITAMIN D (ERGOCALCIFEROL) 1.25 MG (50000 UNIT) PO CAPS: 50000.0000 [IU] | ORAL_CAPSULE | ORAL | 0 refills | Status: AC

## 2018-03-29 NOTE — Progress Notes (Signed)
[name], MD [address]  [place]  [place], [place] [address]  Main: [phone]  Fax: [phone]    Gastroenterology Consultation  Referring Provider:     Dalia Heading, CNM Primary Care Physician:  Dalia Heading, CNM Primary Gastroenterologist:  Dr. [name] Reason for Consultation:   Panulcerative colitis        HPI:   Christina Obrien is a 38 y.o. female referred by [facility]  for consultation & management of chronic diarrhea and weight loss.   Onset of diarrhea and weight loss since early part of summer Nonbloody diarrhea, post prandial, 98mn after eating, lower abdominal cramps, gassy.  She denies nocturnal diarrhea, urgency or incontinence.  She denies epigastric pain, nausea, vomiting.  She used to have regular bowel movements about 1 year ago. Had delivered healthy baby in dec 2017, underwent C-section, she started loosing weight since march/april. She had intermittent loose stools during pregnancy also.  She denies travel outside [place] camping or using antibiotics, NSAIDs or sick contacts She has h/o anemia, first detected in 12/2015 and recently found to have microcytic anemia.  She is currently taking women's multivitamin daily She was also losing weight before pregnancy. Baseline weight is 150-160s.  Today she weighs about 126 pounds She reports that she was tested for HIV at the time of pregnancy and it was negative, TSH was normal, CMP was normal Used to smoke cigarettes regularly 1pack last for 2-3days since early 20s Stopped in pregnancy Restarted after delivery, cut it back since onset of symptoms as she felt smoking made her symptoms worse She has not tried any medications for diarrhea or abdominal cramps  Follow-up visit 04/03/2017: Patient underwent upper endoscopy and colonoscopy. Upper endoscopy revealed nonspecific gastritis. There was no evidence of H. Pylori. Colonoscopy revealed mild inflammation in the  entire colon from rectum to cecum. Terminal ileum was not examined. Pathology revealed mild active colitis including rectum. Patient is here for follow-up visit today. She continues to feel weak, lost about 4 pounds Her GI symptoms remain unchanged. She is taking women's multivitamin and alfa-alfa supplements daily. She reports not taking NSAIDs since the GI procedures as Dr. AVicente Malesrecommended her to stop NSAIDs and thought her inflammation could be NSAID related injury. She continues to smoke, 1 pack last for 2 days  Follow-up visit 10/30/2017: She was taking mesalamine 2.4 g daily for few weeks, she felt better.  Then, she stopped taking mesalamine by herself because she thought it was not completely alleviating her symptoms.  She now presents for follow-up with worsening of symptoms including abdominal pain, nausea, vomiting, postprandial diarrhea, weight loss.  She did not follow up with me in last 7 months.  She thinks something else is going on leading to weight loss other than ulcerative colitis.  She smokes about 2 cigarettes/day.  She said she has been busy with job and kids and did not pay much attention to herself.  She did not follow-up with hematology for iron deficiency anemia.  She states she received for iron infusions since 03/2017.  She denies fever, chills.  She does see intermittent blood in stools.  Follow up visit 11/27/17: She reports feeling fairly well today.  Her GI symptoms have resolved.  Patient started Remicade in 10/2017, received first induction dose.  Scheduled to receive second induction dose today.  She is currently taking prednisone 40 mg daily.  Patient gained 15 pounds since last visit  Follow up visit 03/29/2018: Remicade  induction 1st dose in 10/2017 , 2nd dose on 11/27/2017,  3rd dose in 12/2018 First maintenance infusion was on January 30th 2020 She reports tolerating Remicade infusion well.  She is currently not on mesalamine or azathioprine.  Taking a multivitamin  daily She gained about 15 pounds since last visit.  She reports doing well.  She denies any GI symptoms today. She is going back to work.  She denies drinking alcohol or smoking tobacco   Antiplts/Anticoagulants/Anti thrombotics: none  NSAIDs: she used to take ibuprofen about once a week or less for occasional headaches She denies taking any over-the-counter supplements other than multivitamin   GI Procedures:  EGD and colonoscopy on 12/27/2016 by Dr. Vicente Males The esophagus was normal. Patchy moderate inflammation characterized by adherent blood, congestion (edema), erosions and erythema was found in the gastric antrum. Biopsies were taken with a cold forceps for histology. Two non-bleeding superficial gastric ulcers with no stigmata of bleeding were found in the stomach. The largest lesion was 4 mm in largest dimension. Biopsies were taken with a cold forceps for histology. Duodenum was inflamed  Colonoscopy - Preparation of the colon was poor. - Patchy moderate inflammation was found in the entire examined colon secondary to colitis. Biopsied. - One 6 mm polyp in the ascending colon, removed with a cold biopsy forceps. Resected and retrieved. - The examination was otherwise normal on direct and retroflexion views. DIAGNOSIS:  A. 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  - NEGATIVE FOR ACTIVE INFLAMMATION, INTRAEPITHELIAL LYMPHOCYTOSIS, AND  INFECTIOUS AGENTS.   B. DUODENUM, THIRD PORTION; COLD BIOPSY:  - DUODENAL MUCOSA WITH INTACT VILLI.  - NEGATIVE FOR ACTIVE INFLAMMATION, INTRAEPITHELIAL LYMPHOCYTOSIS, AND  INFECTIOUS AGENTS.   C. STOMACH ADJACENT TO ULCER; COLD BIOPSY:  - ANTRAL-TYPE MUCOSA WITH MILD CHRONIC GASTRITIS, SEE COMMENT.  - REACTIVE FOVEOLAR HYPERPLASIA.  - NEGATIVE FOR HELICOBACTER PYLORI BY IMMUNOHISTOCHEMISTRY ([facility]).   Comment:  There are a few foci of gastric glandular injury surrounded by  lymphocytes,  eosinophils, and macrophages. Neutrophils are not  identified. This focally enhanced gastritis pattern is not specific in  adults but can be a medication effect, and can be seen in inflammatory  bowel disease. No granulomas or viral inclusions are seen. One tiny  crushed mucosal fragment shows a few goblet cells but I cannot be sure  of the type of tissue. Control for H pylori IHC stained appropriately.   D. RIGHT COLON; RANDOM COLD BIOPSY:  - MILD ACTIVE COLITIS WITH FEATURES OF CHRONICITY.   E. COLON POLYP, ASCENDING; COLD BIOPSY:  - CHRONIC ULCER IN A BACKGROUND OF ACTIVE COLITIS, SEE COMMENT.   F. TRANSVERSE COLON; RANDOM COLD BIOPSY:  - MILD ACTIVE COLITIS WITH FEATURES OF CHRONICITY.   G. SIGMOID COLON; RANDOM COLD BIOPSY:  - MILD ACTIVE COLITIS WITH FEATURES OF CHRONICITY.   H. RECTUM; RANDOM COLD BIOPSY:  - MILD ACTIVE PROCTITIS WITH FEATURES OF CHRONICITY.   Comment:  All of the samples are negative for dysplasia and malignancy.   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 Iron deficiency anemia 04/07/2017  . Lower abdominal pain 12/13/2016  . Medical history non-contributory   . Postprandial diarrhea 12/13/2016  . Previous cesarean section 2017   non reassuring FHT  . Weight loss 12/13/2016  Past Surgical History:  Procedure Laterality Date  . CESAREAN SECTION N/A 01/06/2016   Procedure: CESAREAN SECTION;  Surgeon: Malachy Mood, MD;  Location: [facility] ORS;  Service: Obstetrics;  Laterality: N/A;  . COLONOSCOPY WITH PROPOFOL N/A 12/27/2016   Procedure: COLONOSCOPY WITH PROPOFOL;  Surgeon: Jonathon Bellows, MD;  Location: [facility] ENDOSCOPY;  Service: Gastroenterology;  Laterality: N/A;  . ESOPHAGOGASTRODUODENOSCOPY (EGD) WITH PROPOFOL N/A 12/27/2016   Procedure:  ESOPHAGOGASTRODUODENOSCOPY (EGD) WITH PROPOFOL;  Surgeon: Jonathon Bellows, MD;  Location: [facility] ENDOSCOPY;  Service: Gastroenterology;  Laterality: N/A;    Current Outpatient Medications:  Marland Kitchen  Multiple Vitamin (MULTIVITAMIN) tablet, Take 1 tablet daily by mouth., Disp: , Rfl:  .  LIALDA 1.2 g EC tablet, , Disp: , Rfl:     Family History  Problem Relation Age of Onset  . Diabetes Maternal Grandmother   . Liver cancer Maternal Grandfather   . Stroke Neg Hx   . Breast cancer Neg Hx   . Ovarian cancer Neg Hx      Social History   Tobacco Use  . Smoking status: Current Some Day Smoker    Types: Cigarettes    Last attempt to quit: 11/24/2016    Years since quitting: 1.3  . Smokeless tobacco: Never Used  . Tobacco comment: has not smoked in 1-2 weeks  Substance Use Topics  . Alcohol use: Not on file  . Drug use: Not on file    Allergies as of 03/29/2018  . (No Known Allergies)    Review of Systems:    All systems reviewed and negative except where noted in HPI.   Physical Exam:  BP 121/78 (BP Location: Left Arm, Patient Position: Sitting, Cuff Size: Normal)   Pulse 92   Resp 17   Ht 5' 4"  (1.626 m)   Wt 147 lb 6.4 oz (66.9 kg)   SpO2 99%   BMI 25.30 kg/m  No LMP recorded.  General:   Alert, well-built, pleasant and cooperative in NAD Head:  Normocephalic and atraumatic. Eyes:  Sclera clear, no icterus.   Conjunctiva pink. Ears:  Normal auditory acuity. Nose:  No deformity, discharge, or lesions. Mouth:  No deformity or lesions,oropharynx pink & moist. Neck:  Supple; no masses or thyromegaly. Lungs:  Respirations even and unlabored.  Clear throughout to auscultation.   No wheezes, crackles, or rhonchi. No acute distress. Heart:  Regular rate and rhythm; no murmurs, clicks, rubs, or gallops. Abdomen:  Normal bowel sounds. Soft, mild diffuse tenderness and non-distended without masses, hepatosplenomegaly or hernias noted.  No guarding or rebound tenderness.   Rectal: Nor  performed Msk:  Symmetrical without gross deformities. Good, equal movement & strength bilaterally. Pulses:  Normal pulses noted. Extremities:  No clubbing or edema.  No cyanosis. Neurologic:  Alert and oriented x3;  grossly normal neurologically. Skin:  Intact without significant lesions or rashes. No jaundice. Psych:  Alert and cooperative. Normal mood and affect.  Imaging Studies: None  Assessment and Plan:   Christina Obrien is a 38 y.o. African-American female is here follow-up ulcerative pancolitis.  She has significantly elevated fecal calprotectin 475, ESR 72. Her stool studies were negative for infection. Colonoscopy revealed inflammation in the entire colon including rectum,pathology showed mild chronic active colitis in all the segments of colon, there was no evidence of dysplasia. 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  PanUlcerative colitis: extensive, mild in severity with progression of symptoms on mesalamine -Her flareup responded to short course of prednisone -Took azathioprine  50 mg for 1 month, then stopped.  She did tolerate the medication TPMT levels are normal -Started Remicade at the standard dose in 10/2017 due to worsening of symptoms and significant weight loss.  Will continue monotherapy at this time. She is tolerating it well, currently in clinical remission, regained weight -Recommend follow-up colonoscopy to assess response to Remicade in 3 to 4 months  IBD Health Maintenance  1.TB status: gold quantiferon negative   2. Anemia: Severe iron deficiency anemia, status post parenteral iron therapy, anemia resolved.  Ferritin and B12 levels restored.  Recheck CBC today 3.Immunizations: Hep A and B negative serologies, received Twinrix vaccine 2 doses so far, Influenza recommend but patient does not want flu vaccine, prevnar received in 10/2017, pneumovax received in 03/2018, varicella-had chickenpox as a child, Zoster  at age 4 4.Cancer screening I) Colon cancer/dysplasia surveillance:  Colonoscopy in 12/2016, no dysplasia II) Cervical cancer: Annual Pap smear negative to date, has follow-up with PCP in 2 months III) Skin cancer - counseled about annual skin exam by dermatology and skin protection in summer using sun screen SPF > 50, clothing 5.Bone health Vitamin D status: Low vitamin D, 17 in 10/2017 Will give her vitamin D 50K weekly for 12 weeks  Bone density testing:  Not done 5. Labs:  today 6. Smoking: quit smoking 7. NSAIDs and Antibiotics use: continue to avoid   Follow up in 3 months    [name], MD

## 2018-03-30 LAB — COMPREHENSIVE METABOLIC PANEL
ALK PHOS: 60 IU/L (ref 39–117)
ALT: 12 IU/L (ref 0–32)
AST: 14 IU/L (ref 0–40)
Albumin/Globulin Ratio: 1.5 (ref 1.2–2.2)
Albumin: 4.4 g/dL (ref 3.8–4.8)
BUN/Creatinine Ratio: 11 (ref 9–23)
BUN: 10 mg/dL (ref 6–20)
Bilirubin Total: 0.3 mg/dL (ref 0.0–1.2)
CALCIUM: 9.5 mg/dL (ref 8.7–10.2)
CO2: 26 mmol/L (ref 20–29)
CREATININE: 0.87 mg/dL (ref 0.57–1.00)
Chloride: 99 mmol/L (ref 96–106)
GFR calc Af Amer: 97 mL/min/{1.73_m2} (ref >59)
GFR, EST NON AFRICAN AMERICAN: 84 mL/min/{1.73_m2} (ref >59)
GLUCOSE: 69 mg/dL (ref 65–99)
Globulin, Total: 2.9 g/dL (ref 1.5–4.5)
Potassium: 3.6 mmol/L (ref 3.5–5.2)
Sodium: 139 mmol/L (ref 134–144)
Total Protein: 7.3 g/dL (ref 6.0–8.5)

## 2018-03-30 LAB — CBC
Hematocrit: 37.1 % (ref 34.0–46.6)
Hemoglobin: 12.6 g/dL (ref 11.1–15.9)
MCH: 28.7 pg (ref 26.6–33.0)
MCHC: 34 g/dL (ref 31.5–35.7)
MCV: 85 fL (ref 79–97)
PLATELETS: 216 10*3/uL (ref 150–450)
RBC: 4.39 x10E6/uL (ref 3.77–5.28)
RDW: 11.9 % (ref 11.7–15.4)
WBC: 9.4 10*3/uL (ref 3.4–10.8)

## 2018-04-24 ENCOUNTER — Telehealth: Payer: Self-pay | Admitting: Gastroenterology

## 2018-04-24 NOTE — Telephone Encounter (Signed)
PAM FROM [facility] IS CALLING REGARDING THE FAXES SHE HAS SEND SHE WANTS TO KNOW IF WE ARE APEALING IT OR CHANGING THERAPY  CB [phone]

## 2018-04-27 NOTE — Telephone Encounter (Signed)
Spoke with [employer] and appeal process has began we are waiting on appeal results, pt will be notified again once appeal results are back.

## 2018-11-21 ENCOUNTER — Telehealth: Payer: Self-pay | Admitting: Gastroenterology

## 2018-11-21 NOTE — Telephone Encounter (Signed)
Heather from [facility] left vm regarding needing clinical notes please fax to [phone]

## 2018-11-22 NOTE — Telephone Encounter (Signed)
Office notes and labs have been faxed as requested to Bowdle from [facility] at [phone]

## 2018-12-19 ENCOUNTER — Telehealth: Payer: Self-pay | Admitting: Gastroenterology

## 2018-12-19 NOTE — Telephone Encounter (Signed)
Patient called needing a refill on her Remcide Infusion. She state she & the [facility] have tried for a month to get this filled.Please advise.     (Dr Verlin Grills pt)

## 2018-12-24 NOTE — Telephone Encounter (Signed)
Ginger  I need to see her in my office.  It has been a while.  She did not follow my recommendation about colonoscopy in order to check if she responded to Remicade.  Also, I did not see her for more than 6 months  Not sure, what happened with appeal.  Please call the patient and ask her if she even received Remicade since I last saw her  Thanks RV

## 2018-12-24 NOTE — Telephone Encounter (Signed)
Pt has been scheduled for a follow up appt.

## 2018-12-24 NOTE — Telephone Encounter (Signed)
Dr. Marius Ditch, I am not sure where Dennison Bulla was in this process. Do you know if she got the appeal approved? Not sure where to start with this.

## 2018-12-27 ENCOUNTER — Encounter: Payer: Self-pay | Admitting: Gastroenterology

## 2018-12-27 ENCOUNTER — Ambulatory Visit: Payer: BC Managed Care – PPO | Admitting: Gastroenterology

## 2018-12-27 ENCOUNTER — Other Ambulatory Visit: Payer: Self-pay

## 2018-12-27 ENCOUNTER — Encounter (INDEPENDENT_AMBULATORY_CARE_PROVIDER_SITE_OTHER): Payer: Self-pay

## 2018-12-27 VITALS — BP 117/77 | HR 93 | Temp 98.9°F | Ht 64.0 in | Wt 132.1 lb

## 2018-12-27 DIAGNOSIS — Z23 Encounter for immunization: Secondary | ICD-10-CM | POA: Diagnosis not present

## 2018-12-27 DIAGNOSIS — K5190 Ulcerative colitis, unspecified, without complications: Secondary | ICD-10-CM | POA: Diagnosis not present

## 2018-12-27 DIAGNOSIS — K5100 Ulcerative (chronic) pancolitis without complications: Secondary | ICD-10-CM

## 2018-12-27 MED ORDER — PREDNISONE 10 MG PO TABS: ORAL_TABLET | ORAL | 0 refills | Status: DC

## 2018-12-27 NOTE — Progress Notes (Signed)
[name], MD [address]  [place]  [place], [place] [address]  Main: [phone]  Fax: [phone]    Gastroenterology Consultation  Referring Provider:     Dalia Heading, CNM Primary Care Physician:  Dalia Heading, CNM Primary Gastroenterologist:  Dr. [name] Reason for Consultation:   Panulcerative colitis        HPI:   Christina Obrien is a 38 y.o. female referred by [facility]  for consultation & management of chronic diarrhea and weight loss.   Onset of diarrhea and weight loss since early part of summer Nonbloody diarrhea, post prandial, 57mn after eating, lower abdominal cramps, gassy.  She denies nocturnal diarrhea, urgency or incontinence.  She denies epigastric pain, nausea, vomiting.  She used to have regular bowel movements about 1 year ago. Had delivered healthy baby in dec 2017, underwent C-section, she started loosing weight since march/april. She had intermittent loose stools during pregnancy also.  She denies travel outside [place] camping or using antibiotics, NSAIDs or sick contacts She has h/o anemia, first detected in 12/2015 and recently found to have microcytic anemia.  She is currently taking women's multivitamin daily She was also losing weight before pregnancy. Baseline weight is 150-160s.  Today she weighs about 126 pounds She reports that she was tested for HIV at the time of pregnancy and it was negative, TSH was normal, CMP was normal Used to smoke cigarettes regularly 1pack last for 2-3days since early 20s Stopped in pregnancy Restarted after delivery, cut it back since onset of symptoms as she felt smoking made her symptoms worse She has not tried any medications for diarrhea or abdominal cramps  Follow-up visit 04/03/2017: Patient underwent upper endoscopy and colonoscopy. Upper endoscopy revealed nonspecific gastritis. There was no evidence of H. Pylori. Colonoscopy revealed mild inflammation in the  entire colon from rectum to cecum. Terminal ileum was not examined. Pathology revealed mild active colitis including rectum. Patient is here for follow-up visit today. She continues to feel weak, lost about 4 pounds Her GI symptoms remain unchanged. She is taking women's multivitamin and alfa-alfa supplements daily. She reports not taking NSAIDs since the GI procedures as Dr. AVicente Malesrecommended her to stop NSAIDs and thought her inflammation could be NSAID related injury. She continues to smoke, 1 pack last for 2 days  Follow-up visit 10/30/2017: She was taking mesalamine 2.4 g daily for few weeks, she felt better.  Then, she stopped taking mesalamine by herself because she thought it was not completely alleviating her symptoms.  She now presents for follow-up with worsening of symptoms including abdominal pain, nausea, vomiting, postprandial diarrhea, weight loss.  She did not follow up with me in last 7 months.  She thinks something else is going on leading to weight loss other than ulcerative colitis.  She smokes about 2 cigarettes/day.  She said she has been busy with job and kids and did not pay much attention to herself.  She did not follow-up with hematology for iron deficiency anemia.  She states she received for iron infusions since 03/2017.  She denies fever, chills.  She does see intermittent blood in stools.  Follow up visit 11/27/17: She reports feeling fairly well today.  Her GI symptoms have resolved.  Patient started Remicade in 10/2017, received first induction dose.  Scheduled to receive second induction dose today.  She is currently taking prednisone 40 mg daily.  Patient gained 15 pounds since last visit  Follow up visit 03/29/2018: Remicade  induction 1st dose in 10/2017 , 2nd dose on 11/27/2017,  3rd dose in 12/2018 First maintenance infusion was on January 30th 2020 She reports tolerating Remicade infusion well.  She is currently not on mesalamine or azathioprine.  Taking a multivitamin  daily She gained about 15 pounds since last visit.  She reports doing well.  She denies any GI symptoms today. She is going back to work.  She denies drinking alcohol or smoking tobacco  Follow-up visit 12/27/2018 Patient is here to discuss about continuation of Remicade as she could not get her Remicade renewed since she switched her insurance in end of September.  Her last dose of Remicade was in end of July.  For the last 3 weeks or so, she has been experiencing recurrence of her colitis symptoms including severe diarrhea, lower abdominal discomfort associated with weight loss, nausea and vomiting.  She denies subjective fevers, chills.  Patient became tearful that Remicade was working so well and now she has recurrence of symptoms and unable to work.  She feels very weak.  She did not undergo colonoscopy and I recommended and she did not follow-up with me for more than 6 months.  Antiplts/Anticoagulants/Anti thrombotics: none  NSAIDs: she used to take ibuprofen about once a week or less for occasional headaches She denies taking any over-the-counter supplements other than multivitamin   GI Procedures:  EGD and colonoscopy on 12/27/2016 by Dr. Vicente Males The esophagus was normal. Patchy moderate inflammation characterized by adherent blood, congestion (edema), erosions and erythema was found in the gastric antrum. Biopsies were taken with a cold forceps for histology. Two non-bleeding superficial gastric ulcers with no stigmata of bleeding were found in the stomach. The largest lesion was 4 mm in largest dimension. Biopsies were taken with a cold forceps for histology. Duodenum was inflamed  Colonoscopy - Preparation of the colon was poor. - Patchy moderate inflammation was found in the entire examined colon secondary to colitis. Biopsied. - One 6 mm polyp in the ascending colon, removed with a cold biopsy forceps. Resected and retrieved. - The examination was otherwise normal on direct and  retroflexion views. DIAGNOSIS:  A. 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  - NEGATIVE FOR ACTIVE INFLAMMATION, INTRAEPITHELIAL LYMPHOCYTOSIS, AND  INFECTIOUS AGENTS.   B. DUODENUM, THIRD PORTION; COLD BIOPSY:  - DUODENAL MUCOSA WITH INTACT VILLI.  - NEGATIVE FOR ACTIVE INFLAMMATION, INTRAEPITHELIAL LYMPHOCYTOSIS, AND  INFECTIOUS AGENTS.   C. STOMACH ADJACENT TO ULCER; COLD BIOPSY:  - ANTRAL-TYPE MUCOSA WITH MILD CHRONIC GASTRITIS, SEE COMMENT.  - REACTIVE FOVEOLAR HYPERPLASIA.  - NEGATIVE FOR HELICOBACTER PYLORI BY IMMUNOHISTOCHEMISTRY ([facility]).   Comment:  There are a few foci of gastric glandular injury surrounded by  lymphocytes, eosinophils, and macrophages. Neutrophils are not  identified. This focally enhanced gastritis pattern is not specific in  adults but can be a medication effect, and can be seen in inflammatory  bowel disease. No granulomas or viral inclusions are seen. One tiny  crushed mucosal fragment shows a few goblet cells but I cannot be sure  of the type of tissue. Control for H pylori IHC stained appropriately.   D. RIGHT COLON; RANDOM COLD BIOPSY:  - MILD ACTIVE COLITIS WITH FEATURES OF CHRONICITY.   E. COLON POLYP, ASCENDING; COLD BIOPSY:  - CHRONIC ULCER IN A BACKGROUND OF ACTIVE COLITIS, SEE COMMENT.   F. TRANSVERSE COLON; RANDOM COLD BIOPSY:  - MILD ACTIVE COLITIS WITH FEATURES OF CHRONICITY.   G. SIGMOID COLON; RANDOM  COLD BIOPSY:  - MILD ACTIVE COLITIS WITH FEATURES OF CHRONICITY.   H. RECTUM; RANDOM COLD BIOPSY:  - MILD ACTIVE PROCTITIS WITH FEATURES OF CHRONICITY.   Comment:  All of the samples are negative for dysplasia and malignancy.   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 Iron  deficiency anemia 04/07/2017  . Lower abdominal pain 12/13/2016  . Medical history non-contributory   . Postprandial diarrhea 12/13/2016  . Previous cesarean section 2017   non reassuring FHT  . Weight loss 12/13/2016    Past Surgical History:  Procedure Laterality Date  . CESAREAN SECTION N/A 01/06/2016   Procedure: CESAREAN SECTION;  Surgeon: Malachy Mood, MD;  Location: [facility] ORS;  Service: Obstetrics;  Laterality: N/A;  . COLONOSCOPY WITH PROPOFOL N/A 12/27/2016   Procedure: COLONOSCOPY WITH PROPOFOL;  Surgeon: Jonathon Bellows, MD;  Location: [facility] ENDOSCOPY;  Service: Gastroenterology;  Laterality: N/A;  . ESOPHAGOGASTRODUODENOSCOPY (EGD) WITH PROPOFOL N/A 12/27/2016   Procedure: ESOPHAGOGASTRODUODENOSCOPY (EGD) WITH PROPOFOL;  Surgeon: Jonathon Bellows, MD;  Location: [facility] ENDOSCOPY;  Service: Gastroenterology;  Laterality: N/A;    Current Outpatient Medications:  .  LIALDA 1.2 g EC tablet, , Disp: , Rfl:  .  predniSONE (DELTASONE) 10 MG tablet, Take 40 mg daily for 2 weeks, decrease to 30 mg daily for 1 week then 20 mg daily for 1 week, then 10 mg daily, Disp: 100 tablet, Rfl: 0    Family History  Problem Relation Age of Onset  . Diabetes Maternal Grandmother   . Liver cancer Maternal Grandfather   . Stroke Neg Hx   . Breast cancer Neg Hx   . Ovarian cancer Neg Hx      Social History   Tobacco Use  . Smoking status: Former Smoker    Types: Cigarettes    Quit date: 11/24/2016    Years since quitting: 2.0  . Smokeless tobacco: Never Used  . Tobacco comment: has not smoked in 1-2 weeks  Substance Use Topics  . Alcohol use: Not Currently    Frequency: Never  . Drug use: Not on file    Allergies as of 12/27/2018  . (No Known Allergies)    Review of Systems:    All systems reviewed and negative except where noted in HPI.   Physical Exam:  BP 117/77 (BP Location: Left Arm, Patient Position: Sitting, Cuff Size: Normal)   Pulse 93   Temp 98.9 F (37.2 C) (Oral)   Ht 5'  4" (1.626 m)   Wt 132 lb 2 oz (59.9 kg)   BMI 22.68 kg/m  No LMP recorded.  General:   Alert, well-built, pleasant and cooperative in NAD Head:  Normocephalic and atraumatic. Eyes:  Sclera clear, no icterus.   Conjunctiva pink. Ears:  Normal auditory acuity. Nose:  No deformity, discharge, or lesions. Mouth:  No deformity or lesions,oropharynx pink & moist. Neck:  Supple; no masses or thyromegaly. Lungs:  Respirations even and unlabored.  Clear throughout to auscultation.   No wheezes, crackles, or rhonchi. No acute distress. Heart:  Regular rate and rhythm; no murmurs, clicks, rubs, or gallops. Abdomen:  Normal bowel sounds. Soft, mild diffuse tenderness and non-distended without masses, hepatosplenomegaly or hernias noted.  No guarding or rebound tenderness.   Rectal: Nor performed Msk:  Symmetrical without gross deformities. Good, equal movement &  strength bilaterally. Pulses:  Normal pulses noted. Extremities:  No clubbing or edema.  No cyanosis. Neurologic:  Alert and oriented x3;  grossly normal neurologically. Skin:  Intact without significant lesions or rashes. No jaundice. Psych:  Alert and cooperative. Normal mood and affect.  Imaging Studies: None  Assessment and Plan:   Cyla Haluska is a 38 y.o. African-American female is here follow-up ulcerative pancolitis.  She has significantly elevated fecal calprotectin 475, ESR 72. Her stool studies were negative for infection. Colonoscopy revealed inflammation in the entire colon including rectum,pathology showed mild chronic active colitis in all the segments of colon, there was no evidence of dysplasia. 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  PanUlcerative colitis: extensive, mild in severity with progression of symptoms on mesalamine, steroid responsive.  Short course of azathioprine and later discontinued. TPMT levels are normal Started Remicade at the standard dose in  10/2017 due to worsening of symptoms and significant weight loss.  Responded very well to monotherapy, regained weight.  Therapy interrupted since September 2020, currently has exacerbation of ulcerative colitis -Recommend to start prednisone 40 mg daily for 2 weeks followed by taper -Recommend to restart Remicade 33m/kg body weight every 8 weeks -We will check infliximab antibodies -Recheck fecal calprotectin levels, CBC, CMP, CRP, iron studies, QuantiFERON gold, vitamin D levels -Recommend follow-up colonoscopy as well as fecal calprotectin levels to assess response to Remicade in 3 to 4 months  IBD Health Maintenance  1.TB status: gold quantiferon negative, recheck today 2. Anemia: Severe iron deficiency anemia, status post parenteral iron therapy, anemia resolved.  Ferritin and B12 levels restored.  Recheck CBC today 3.Immunizations: Hep A and B negative serologies, received Twinrix vaccine 2 doses so far, administer second booster today, Influenza recommend but patient does not want flu vaccine, prevnar received in 10/2017, pneumovax received in 12/2018, varicella-had chickenpox as a child, Zoster at age 38 4Cancer screening I) Colon cancer/dysplasia surveillance:  Colonoscopy in 12/2016, no dysplasia II) Cervical cancer: Annual Pap smear negative to date, has follow-up with PCP next month III) Skin cancer - counseled about annual skin exam by dermatology and skin protection in summer using sun screen SPF > 50, clothing 5.Bone health Vitamin D status: Low vitamin D, 17 in 10/2017, recheck levels today.  Treated in the past Bone density testing:  Not done 5. Labs:  today 6. Smoking: quit smoking 7. NSAIDs and Antibiotics use: continue to avoid   Follow up in 2 months    RCephas Darby MD

## 2018-12-31 ENCOUNTER — Telehealth: Payer: Self-pay

## 2018-12-31 DIAGNOSIS — K5100 Ulcerative (chronic) pancolitis without complications: Secondary | ICD-10-CM

## 2018-12-31 MED ORDER — VITAMIN D (ERGOCALCIFEROL) 1.25 MG (50000 UNIT) PO CAPS: 50000.0000 [IU] | ORAL_CAPSULE | ORAL | 0 refills | Status: DC

## 2018-12-31 NOTE — Telephone Encounter (Signed)
-----   Message from Lin Landsman, MD sent at 12/30/2018 11:01 PM EST ----- Quantiferon gold came back indeterminate, recommend to repeat test after 2weeks Severe Vit D deficiency, recommend vit D 50K capsules weekly for 16 weeks Potassium levels are slightly low, recommend to eat bananas  Rohini Vanga

## 2018-12-31 NOTE — Telephone Encounter (Signed)
Patient verbalized understanding of lab results. Patient states she will come back in 2 weeks to get labs. Sent medication to pharmacy. Patient states she had infusion on 12/29/2018

## 2019-01-09 LAB — COMPREHENSIVE METABOLIC PANEL
ALT: 10 IU/L (ref 0–32)
AST: 13 IU/L (ref 0–40)
Albumin/Globulin Ratio: 1.2 (ref 1.2–2.2)
Albumin: 4.2 g/dL (ref 3.8–4.8)
Alkaline Phosphatase: 72 IU/L (ref 39–117)
BUN/Creatinine Ratio: 10 (ref 9–23)
BUN: 8 mg/dL (ref 6–24)
Bilirubin Total: 0.3 mg/dL (ref 0.0–1.2)
CO2: 25 mmol/L (ref 20–29)
Calcium: 9.1 mg/dL (ref 8.7–10.2)
Chloride: 101 mmol/L (ref 96–106)
Creatinine, Ser: 0.82 mg/dL (ref 0.57–1.00)
GFR calc Af Amer: 104 mL/min/{1.73_m2} (ref >59)
GFR calc non Af Amer: 90 mL/min/{1.73_m2} (ref >59)
Globulin, Total: 3.5 g/dL (ref 1.5–4.5)
Glucose: 70 mg/dL (ref 65–99)
Potassium: 3.3 mmol/L — ABNORMAL LOW (ref 3.5–5.2)
Sodium: 141 mmol/L (ref 134–144)
Total Protein: 7.7 g/dL (ref 6.0–8.5)

## 2019-01-09 LAB — IRON AND TIBC
Iron Saturation: 12 % — ABNORMAL LOW (ref 15–55)
Iron: 27 ug/dL (ref 27–159)
Total Iron Binding Capacity: 229 ug/dL — ABNORMAL LOW (ref 250–450)
UIBC: 202 ug/dL (ref 131–425)

## 2019-01-09 LAB — QUANTIFERON-TB GOLD PLUS
QuantiFERON Mitogen Value: 0.11 IU/mL
QuantiFERON Nil Value: 0.08 IU/mL
QuantiFERON TB1 Ag Value: 0.08 IU/mL
QuantiFERON TB2 Ag Value: 0.09 IU/mL
QuantiFERON-TB Gold Plus: UNDETERMINED — AB

## 2019-01-09 LAB — CBC
Hematocrit: 36.6 % (ref 34.0–46.6)
Hemoglobin: 12 g/dL (ref 11.1–15.9)
MCH: 28.2 pg (ref 26.6–33.0)
MCHC: 32.8 g/dL (ref 31.5–35.7)
MCV: 86 fL (ref 79–97)
Platelets: 256 10*3/uL (ref 150–450)
RBC: 4.25 x10E6/uL (ref 3.77–5.28)
RDW: 12.3 % (ref 11.7–15.4)
WBC: 10.9 10*3/uL — ABNORMAL HIGH (ref 3.4–10.8)

## 2019-01-09 LAB — C-REACTIVE PROTEIN: CRP: 25 mg/L — ABNORMAL HIGH (ref 0–10)

## 2019-01-09 LAB — INFLIXIMAB (IFX) CONC+ IFX AB
Anti-Infliximab Antibody: 35 ng/mL
Infliximab Drug Level: <0.4 ug/mL

## 2019-01-09 LAB — FERRITIN: Ferritin: 79 ng/mL (ref 15–150)

## 2019-01-09 LAB — VITAMIN D 25 HYDROXY (VIT D DEFICIENCY, FRACTURES): Vit D, 25-Hydroxy: 11 ng/mL — ABNORMAL LOW (ref 30.0–100.0)

## 2019-01-22 ENCOUNTER — Telehealth: Payer: Self-pay

## 2019-01-22 NOTE — Telephone Encounter (Signed)
Spoke with pt and she will go to [facility] to repeat lab.

## 2019-01-22 NOTE — Progress Notes (Signed)
Please call patient about repeat quantiferon that has been ordered  Christina Obrien

## 2019-01-28 ENCOUNTER — Telehealth: Payer: Self-pay

## 2019-01-28 ENCOUNTER — Other Ambulatory Visit: Payer: Self-pay

## 2019-01-28 DIAGNOSIS — K5190 Ulcerative colitis, unspecified, without complications: Secondary | ICD-10-CM

## 2019-01-28 DIAGNOSIS — K5100 Ulcerative (chronic) pancolitis without complications: Secondary | ICD-10-CM

## 2019-01-28 NOTE — Telephone Encounter (Signed)
LVM notifying pt that labs have been released to [facility] and she can go to any [facility] for labs

## 2019-01-28 NOTE — Telephone Encounter (Signed)
Patient is calling about her quantiferon TB test. Patient states she went to get the lab work done at the [facility] location inside [employer]. She stated that they said they did not have the order.

## 2019-02-03 LAB — COMPREHENSIVE METABOLIC PANEL
ALT: 10 IU/L (ref 0–32)
AST: 14 IU/L (ref 0–40)
Albumin/Globulin Ratio: 1.4 (ref 1.2–2.2)
Albumin: 4.1 g/dL (ref 3.8–4.8)
Alkaline Phosphatase: 59 IU/L (ref 39–117)
BUN/Creatinine Ratio: 16 (ref 9–23)
BUN: 13 mg/dL (ref 6–24)
Bilirubin Total: 0.2 mg/dL (ref 0.0–1.2)
CO2: 24 mmol/L (ref 20–29)
Calcium: 8.8 mg/dL (ref 8.7–10.2)
Chloride: 103 mmol/L (ref 96–106)
Creatinine, Ser: 0.81 mg/dL (ref 0.57–1.00)
GFR calc Af Amer: 105 mL/min/{1.73_m2} (ref >59)
GFR calc non Af Amer: 91 mL/min/{1.73_m2} (ref >59)
Globulin, Total: 2.9 g/dL (ref 1.5–4.5)
Glucose: 65 mg/dL (ref 65–99)
Potassium: 3.8 mmol/L (ref 3.5–5.2)
Sodium: 138 mmol/L (ref 134–144)
Total Protein: 7 g/dL (ref 6.0–8.5)

## 2019-02-03 LAB — CBC
Hematocrit: 33.2 % — ABNORMAL LOW (ref 34.0–46.6)
Hemoglobin: 11 g/dL — ABNORMAL LOW (ref 11.1–15.9)
MCH: 28.9 pg (ref 26.6–33.0)
MCHC: 33.1 g/dL (ref 31.5–35.7)
MCV: 87 fL (ref 79–97)
Platelets: 196 10*3/uL (ref 150–450)
RBC: 3.8 x10E6/uL (ref 3.77–5.28)
RDW: 12.8 % (ref 11.7–15.4)
WBC: 9 10*3/uL (ref 3.4–10.8)

## 2019-02-03 LAB — QUANTIFERON-TB GOLD PLUS
QuantiFERON Mitogen Value: >10 IU/mL
QuantiFERON Nil Value: 0.05 IU/mL
QuantiFERON TB1 Ag Value: 0.06 IU/mL
QuantiFERON TB2 Ag Value: 0.05 IU/mL
QuantiFERON-TB Gold Plus: NEGATIVE

## 2019-02-03 LAB — C-REACTIVE PROTEIN: CRP: <1 mg/L (ref 0–10)

## 2019-02-03 LAB — IRON AND TIBC
Iron Saturation: 31 % (ref 15–55)
Iron: 77 ug/dL (ref 27–159)
Total Iron Binding Capacity: 246 ug/dL — ABNORMAL LOW (ref 250–450)
UIBC: 169 ug/dL (ref 131–425)

## 2019-02-03 LAB — FERRITIN: Ferritin: 28 ng/mL (ref 15–150)

## 2019-02-03 LAB — VITAMIN D 25 HYDROXY (VIT D DEFICIENCY, FRACTURES): Vit D, 25-Hydroxy: 27.2 ng/mL — ABNORMAL LOW (ref 30.0–100.0)

## 2019-02-04 ENCOUNTER — Telehealth: Payer: Self-pay

## 2019-02-04 NOTE — Telephone Encounter (Signed)
Pt has been notified of results and verbalized understanding  

## 2019-02-04 NOTE — Telephone Encounter (Signed)
-----   Message from Lin Landsman, MD sent at 02/03/2019  4:55 PM EST ----- Please inform patient that her labs overall look better.  Her iron is low and she is slightly anemic.  I recommend oral iron 1 to 2 pills daily with food.  Please caution her about iron-induced constipation and to take stool softener if needed Vitamin D is also mildly low, recommend over-the-counter vitamin D 500 units daily  She should keep up with her Remicade infusions and I will see her for follow-up in February  ThanksRohini Obrien

## 2019-03-11 ENCOUNTER — Ambulatory Visit: Payer: BC Managed Care – PPO | Admitting: Gastroenterology

## 2019-03-15 ENCOUNTER — Other Ambulatory Visit: Payer: Self-pay

## 2019-03-15 ENCOUNTER — Encounter: Payer: Self-pay | Admitting: Gastroenterology

## 2019-03-15 ENCOUNTER — Ambulatory Visit (INDEPENDENT_AMBULATORY_CARE_PROVIDER_SITE_OTHER): Payer: BC Managed Care – PPO | Admitting: Gastroenterology

## 2019-03-15 DIAGNOSIS — K5100 Ulcerative (chronic) pancolitis without complications: Secondary | ICD-10-CM

## 2019-03-15 DIAGNOSIS — D500 Iron deficiency anemia secondary to blood loss (chronic): Secondary | ICD-10-CM

## 2019-03-15 DIAGNOSIS — E559 Vitamin D deficiency, unspecified: Secondary | ICD-10-CM | POA: Diagnosis not present

## 2019-03-15 MED ORDER — NA SULFATE-K SULFATE-MG SULF 17.5-3.13-1.6 GM/177ML PO SOLN: 354.0000 mL | Freq: Once | ORAL | 0 refills | Status: AC

## 2019-03-15 NOTE — Progress Notes (Signed)
Sherri Sear, MD [address]  [place]  [place], [place] [address]  Main: [phone]  Fax: [phone]    Gastroenterology Consultation Video Visit  Referring Provider:     Dalia Heading, CNM Primary Care Physician:  Dalia Heading, CNM Primary Gastroenterologist:  Dr. [name] Reason for Consultation:   Ulcerative colitis        HPI:   Christina Obrien is a 39 y.o. female referred by Dr. Dalia Heading, CNM  for consultation & management of ulcerative colitis  Virtual Visit Video Note  I connected with Earl Gala on 03/15/19 at  9:15 AM EST by video and verified that I am speaking with the correct person using two identifiers.   I discussed the limitations, risks, security and privacy concerns of performing an evaluation and management service by video and the availability of in person appointments. I also discussed with the patient that there may be a patient responsible charge related to this service. The patient expressed understanding and agreed to proceed.  Location of the Patient: Home  Location of the provider: Home office  Persons participating in the visit: Patient and provider only   History of Present Illness: Ms. Heater is feeling well today.  Since last visit, we have restarted her Remicade after a gap of about 6 months due to change in her insurance.  She reports feeling normal, her symptoms have resolved.  Energy levels have improved.  She is taking only vitamin D and multivitamin in addition to Remicade    NSAIDs: None  Antiplts/Anticoagulants/Anti thrombotics: None  GI Procedures: Please refer to my previous notes  Past Medical History:  Diagnosis Date  . Iron deficiency anemia 04/07/2017  . Lower abdominal pain 12/13/2016  . Medical history non-contributory   . Postprandial diarrhea 12/13/2016  . Previous cesarean section 2017   non reassuring FHT  . Weight loss 12/13/2016    Past Surgical History:    Procedure Laterality Date  . CESAREAN SECTION N/A 01/06/2016   Procedure: CESAREAN SECTION;  Surgeon: Malachy Mood, MD;  Location: [facility] ORS;  Service: Obstetrics;  Laterality: N/A;  . COLONOSCOPY WITH PROPOFOL N/A 12/27/2016   Procedure: COLONOSCOPY WITH PROPOFOL;  Surgeon: Jonathon Bellows, MD;  Location: [facility] ENDOSCOPY;  Service: Gastroenterology;  Laterality: N/A;  . ESOPHAGOGASTRODUODENOSCOPY (EGD) WITH PROPOFOL N/A 12/27/2016   Procedure: ESOPHAGOGASTRODUODENOSCOPY (EGD) WITH PROPOFOL;  Surgeon: Jonathon Bellows, MD;  Location: [facility] ENDOSCOPY;  Service: Gastroenterology;  Laterality: N/A;    Current Outpatient Medications:  Marland Kitchen  Vitamin D, Ergocalciferol, (DRISDOL) 1.25 MG (50000 UT) CAPS capsule, Take 1 capsule (50,000 Units total) by mouth every 7 (seven) days., Disp: 16 capsule, Rfl: 0   Family History  Problem Relation Age of Onset  . Diabetes Maternal Grandmother   . Liver cancer Maternal Grandfather   . Stroke Neg Hx   . Breast cancer Neg Hx   . Ovarian cancer Neg Hx      Social History   Tobacco Use  . Smoking status: Former Smoker    Types: Cigarettes    Quit date: 11/24/2016    Years since quitting: 2.3  . Smokeless tobacco: Never Used  . Tobacco comment: has not smoked in 1-2 weeks  Substance Use Topics  . Alcohol use: Not Currently  . Drug use: Not on file    Allergies as of 03/15/2019  . (No Known Allergies)    Imaging Studies: Reviewed  Assessment and Plan:   Vasilia Dise is a 39 y.o.  female with history of ulcerative pancolitis currently maintained on Remicade 5 mg/KG every 8 weeks monotherapy, currently in clinical remission  Continue Remicade every 8 weeks  Recheck fecal calprotectin levels Recommend colonoscopy to assess response to Remicade  IBD Health Maintenance  1.TB status: gold quantiferon negative 2. Anemia: Severe iron deficiency anemia, status post parenteral iron therapy, anemia resolved.  Ferritin and B12 levels restored.  Has mild  anemia with low ferritin, currently on oral iron 3.Immunizations: Hep A and B negative serologies, received Twinrix vaccine 3 doses, Influenza recommend but patient does not want flu vaccine, prevnar received in 10/2017, pneumovax received in 12/2018, varicella-had chickenpox as a child, Zoster, recommend Shingrix vaccine 4.Cancer screening I) Colon cancer/dysplasia surveillance:  Colonoscopy in 12/2016, no dysplasia, repeat colonoscopy II) Cervical cancer: Annual Pap smear negative to date, has follow-up with PCP next month III) Skin cancer - counseled about annual skin exam by dermatology and skin protection in summer using sun screen SPF > 50, clothing 5.Bone health Vitamin D status: Low vitamin D, currently being treated Bone density testing:  Not done 5. Labs: With every other Remicade infusion 6. Smoking: quit smoking 7. NSAIDs and Antibiotics use: continue to avoid   Follow up in 3 months    [name], MD   Follow Up Instructions:   I discussed the assessment and treatment plan with the patient. The patient was provided an opportunity to ask questions and all were answered. The patient agreed with the plan and demonstrated an understanding of the instructions.   The patient was advised to call back or seek an in-person evaluation if the symptoms worsen or if the condition fails to improve as anticipated.  I provided 13 minutes of face-to-face time during this encounter.   [name], MD

## 2019-03-15 NOTE — Addendum Note (Signed)
Addended by: Ulyess Blossom L on: 03/15/2019 09:48 AM   Modules accepted: Orders

## 2019-03-25 ENCOUNTER — Other Ambulatory Visit: Admission: RE | Admit: 2019-03-25 | Payer: BC Managed Care – PPO | Source: Ambulatory Visit

## 2019-03-26 ENCOUNTER — Other Ambulatory Visit
Admission: RE | Admit: 2019-03-26 | Discharge: 2019-03-26 | Disposition: A | Discharge status: Home / Self Care | Payer: BC Managed Care – PPO | Source: Ambulatory Visit | Attending: Gastroenterology | Admitting: Gastroenterology

## 2019-03-26 DIAGNOSIS — Z01812 Encounter for preprocedural laboratory examination: Secondary | ICD-10-CM | POA: Diagnosis present

## 2019-03-26 DIAGNOSIS — Z20822 Contact with and (suspected) exposure to covid-19: Secondary | ICD-10-CM | POA: Diagnosis not present

## 2019-03-26 LAB — SARS CORONAVIRUS 2 (TAT 6-24 HRS): SARS Coronavirus 2: NEGATIVE

## 2019-03-27 ENCOUNTER — Encounter: Payer: Self-pay | Admitting: Gastroenterology

## 2019-03-27 ENCOUNTER — Ambulatory Visit
Admission: RE | Admit: 2019-03-27 | Discharge: 2019-03-27 | Disposition: A | Discharge status: Home / Self Care | Payer: BC Managed Care – PPO | Source: Ambulatory Visit | Attending: Gastroenterology | Admitting: Gastroenterology

## 2019-03-27 ENCOUNTER — Encounter
Admission: RE | Disposition: A | Discharge status: Home / Self Care | Payer: Self-pay | Source: Ambulatory Visit | Attending: Gastroenterology

## 2019-03-27 ENCOUNTER — Other Ambulatory Visit: Payer: Self-pay

## 2019-03-27 ENCOUNTER — Ambulatory Visit: Payer: BC Managed Care – PPO | Admitting: Certified Registered"

## 2019-03-27 DIAGNOSIS — Z8711 Personal history of peptic ulcer disease: Secondary | ICD-10-CM | POA: Diagnosis not present

## 2019-03-27 DIAGNOSIS — K56699 Other intestinal obstruction unspecified as to partial versus complete obstruction: Secondary | ICD-10-CM | POA: Insufficient documentation

## 2019-03-27 DIAGNOSIS — Z87891 Personal history of nicotine dependence: Secondary | ICD-10-CM | POA: Insufficient documentation

## 2019-03-27 DIAGNOSIS — K644 Residual hemorrhoidal skin tags: Secondary | ICD-10-CM | POA: Diagnosis not present

## 2019-03-27 DIAGNOSIS — K5100 Ulcerative (chronic) pancolitis without complications: Secondary | ICD-10-CM | POA: Diagnosis present

## 2019-03-27 DIAGNOSIS — K51012 Ulcerative (chronic) pancolitis with intestinal obstruction: Secondary | ICD-10-CM

## 2019-03-27 DIAGNOSIS — K51512 Left sided colitis with intestinal obstruction: Secondary | ICD-10-CM | POA: Diagnosis not present

## 2019-03-27 HISTORY — PX: COLONOSCOPY WITH PROPOFOL: SHX5780

## 2019-03-27 LAB — POCT PREGNANCY, URINE: Preg Test, Ur: NEGATIVE

## 2019-03-27 SURGERY — COLONOSCOPY WITH PROPOFOL
Anesthesia: General

## 2019-03-27 MED ORDER — GLYCOPYRROLATE 0.2 MG/ML IJ SOLN
INTRAMUSCULAR | Status: DC | PRN
  Administered 2019-03-27: .2 mg via INTRAVENOUS

## 2019-03-27 MED ORDER — PROPOFOL 500 MG/50ML IV EMUL
INTRAVENOUS | Status: DC | PRN
  Administered 2019-03-27: 165 ug/kg/min via INTRAVENOUS

## 2019-03-27 MED ORDER — LIDOCAINE HCL (CARDIAC) PF 100 MG/5ML IV SOSY
PREFILLED_SYRINGE | INTRAVENOUS | Status: DC | PRN
  Administered 2019-03-27: 100 mg via INTRATRACHEAL

## 2019-03-27 MED ORDER — SODIUM CHLORIDE 0.9 % IV SOLN: INTRAVENOUS | Status: DC

## 2019-03-27 MED ORDER — PROPOFOL 10 MG/ML IV BOLUS
INTRAVENOUS | Status: DC | PRN
  Administered 2019-03-27: 10 mg via INTRAVENOUS
  Administered 2019-03-27: 60 mg via INTRAVENOUS
  Administered 2019-03-27 (×2): 10 mg via INTRAVENOUS

## 2019-03-27 NOTE — H&P (Signed)
[name], MD [address]  [place]  [place], [place] [address]  Main: [phone]  Fax: [phone] Pager: [phone]  Primary Care Physician:  Dalia Heading, CNM Primary Gastroenterologist:  Dr. [name]  Pre-Procedure History & Physical: HPI:  Christina Obrien is a 39 y.o. female is here for an colonoscopy.   Past Medical History:  Diagnosis Date  . Iron deficiency anemia 04/07/2017  . Lower abdominal pain 12/13/2016  . Medical history non-contributory   . Postprandial diarrhea 12/13/2016  . Previous cesarean section 2017   non reassuring FHT  . Weight loss 12/13/2016    Past Surgical History:  Procedure Laterality Date  . CESAREAN SECTION N/A 01/06/2016   Procedure: CESAREAN SECTION;  Surgeon: Malachy Mood, MD;  Location: [facility] ORS;  Service: Obstetrics;  Laterality: N/A;  . COLONOSCOPY WITH PROPOFOL N/A 12/27/2016   Procedure: COLONOSCOPY WITH PROPOFOL;  Surgeon: Jonathon Bellows, MD;  Location: [facility] ENDOSCOPY;  Service: Gastroenterology;  Laterality: N/A;  . ESOPHAGOGASTRODUODENOSCOPY (EGD) WITH PROPOFOL N/A 12/27/2016   Procedure: ESOPHAGOGASTRODUODENOSCOPY (EGD) WITH PROPOFOL;  Surgeon: Jonathon Bellows, MD;  Location: [facility] ENDOSCOPY;  Service: Gastroenterology;  Laterality: N/A;    Prior to Admission medications   Medication Sig Start Date End Date Taking? Authorizing Provider  Vitamin D, Ergocalciferol, (DRISDOL) 1.25 MG (50000 UT) CAPS capsule Take 1 capsule (50,000 Units total) by mouth every 7 (seven) days. 12/31/18   Lin Landsman, MD    Allergies as of 03/15/2019  . (No Known Allergies)    Family History  Problem Relation Age of Onset  . Diabetes Maternal Grandmother   . Liver cancer Maternal Grandfather   . Stroke Neg Hx   . Breast cancer Neg Hx   . Ovarian cancer Neg Hx     Social History   Socioeconomic History  . Marital status: Single    Spouse name: Not on file  . Number of children: 2  . Years of education: Not  on file  . Highest education level: Not on file  Occupational History  . Not on file  Tobacco Use  . Smoking status: Former Smoker    Types: Cigarettes    Quit date: 11/24/2016    Years since quitting: 2.3  . Smokeless tobacco: Never Used  . Tobacco comment: has not smoked in 1-2 weeks  Substance and Sexual Activity  . Alcohol use: Not Currently  . Drug use: Never  . Sexual activity: Yes    Partners: Male    Birth control/protection: None    Comment: occasional condoms  Other Topics Concern  . Not on file  Social History Narrative  . Not on file   Social Determinants of Health   Financial Resource Strain:   . Difficulty of Paying Living Expenses: Not on file  Food Insecurity:   . Worried About Charity fundraiser in the Last Year: Not on file  . Ran Out of Food in the Last Year: Not on file  Transportation Needs:   . Lack of Transportation (Medical): Not on file  . Lack of Transportation (Non-Medical): Not on file  Physical Activity:   . Days of Exercise per Week: Not on file  . Minutes of Exercise per Session: Not on file  Stress:   . Feeling of Stress : Not on file  Social Connections:   . Frequency of Communication with Friends and Family: Not on file  . Frequency of Social Gatherings with Friends and Family: Not on file  . Attends Religious  Services: Not on file  . Active Member of Clubs or Organizations: Not on file  . Attends Archivist Meetings: Not on file  . Marital Status: Not on file  Intimate Partner Violence:   . Fear of Current or Ex-Partner: Not on file  . Emotionally Abused: Not on file  . Physically Abused: Not on file  . Sexually Abused: Not on file    Review of Systems: See HPI, otherwise negative ROS  Physical Exam: BP 113/75   Pulse 73   Temp (!) 97.2 F (36.2 C) (Temporal)   Resp 17   Ht 5' 5"  (1.651 m)   Wt 64.4 kg   SpO2 100%   BMI 23.63 kg/m  General:   Alert,  pleasant and cooperative in NAD Head:  Normocephalic and  atraumatic. Neck:  Supple; no masses or thyromegaly. Lungs:  Clear throughout to auscultation.    Heart:  Regular rate and rhythm. Abdomen:  Soft, nontender and nondistended. Normal bowel sounds, without guarding, and without rebound.   Neurologic:  Alert and  oriented x4;  grossly normal neurologically.  Impression/Plan: Christina Obrien is here for an colonoscopy to be performed for follow up of UC  Risks, benefits, limitations, and alternatives regarding  colonoscopy have been reviewed with the patient.  Questions have been answered.  All parties agreeable.   Sherri Sear, MD  03/27/2019, 9:48 AM

## 2019-03-27 NOTE — Op Note (Signed)
[facility] Gastroenterology Patient Name: Christina Obrien Procedure Date: 03/27/2019 10:35 AM MRN: [id number] Account #: [id number] Date of Birth: [birth year] Admit Type: Outpatient Age: 39 Room: [facility] ENDO ROOM 3 Gender: Female Note Status: Finalized Procedure:             Colonoscopy Indications:           Follow-up of chronic ulcerative pancolitis, Assess                         therapeutic response to therapy of chronic ulcerative                         pancolitis Providers:             Lin Landsman MD, MD Referring MD:          Jesus Genera. Danise Mina (Referring MD) Medicines:             Monitored Anesthesia Care Complications:         No immediate complications. Estimated blood loss: None. Procedure:             Pre-Anesthesia Assessment:                        - Prior to the procedure, a History and Physical was                         performed, and patient medications and allergies were                         reviewed. The patient is competent. The risks and                         benefits of the procedure and the sedation options and                         risks were discussed with the patient. All questions                         were answered and informed consent was obtained.                         Patient identification and proposed procedure were                         verified by the physician, the nurse, the                         anesthesiologist, the anesthetist and the technician                         in the pre-procedure area in the procedure room in the                         endoscopy suite. Mental Status Examination: alert and                         oriented. Airway Examination: normal oropharyngeal  airway and neck mobility. Respiratory Examination:                         clear to auscultation. CV Examination: normal.                         Prophylactic Antibiotics: The patient does not require              prophylactic antibiotics. Prior Anticoagulants: The                         patient has taken no previous anticoagulant or                         antiplatelet agents. ASA Grade Assessment: II - A                         patient with mild systemic disease. After reviewing                         the risks and benefits, the patient was deemed in                         satisfactory condition to undergo the procedure. The                         anesthesia plan was to use monitored anesthesia care                         (MAC). Immediately prior to administration of                         medications, the patient was re-assessed for adequacy                         to receive sedatives. The heart rate, respiratory                         rate, oxygen saturations, blood pressure, adequacy of                         pulmonary ventilation, and response to care were                         monitored throughout the procedure. The physical                         status of the patient was re-assessed after the                         procedure.                        After obtaining informed consent, the colonoscope was                         passed under direct vision. Throughout the procedure,  the patient's blood pressure, pulse, and oxygen                         saturations were monitored continuously. The                         Colonoscope was introduced through the anus and                         advanced to the the terminal ileum, with                         identification of the appendiceal orifice and IC                         valve. The colonoscopy was performed with difficulty                         due to bowel stenosis. Successful completion of the                         procedure was aided by withdrawing the scope and                         replacing with the adult endoscope. The patient                         tolerated the procedure well.  The quality of the bowel                         preparation was fair. Findings:      Skin tags were found on perianal exam.      The terminal ileum appeared normal. Biopsies were taken with a cold       forceps for histology.      A benign-appearing, intrinsic ulcerated, moderate stenosis was found in       the recto-sigmoid colon from underlying UC and was traversed. Biopsies       were taken with a cold forceps for histology.      Inflammation was found in a continuous and circumferential pattern in       the rectosigmoid colon. This was graded as Mayo Score 3 (severe, with       spontaneous bleeding, ulcerations), and when compared to the previous       examination, the findings are worsened. Biopsies were taken with a cold       forceps for histology.      Inflammation was not found based on the endoscopic appearance of the       mucosa in the rest of colon including cecum, ascending, transverse,       descending colon and rectum. This was graded as Mayo Score 0 (normal or       inactive disease), and when compared to the previous examination, the       findings are improved. Biopsies were taken with a cold forceps for       histology. Estimated blood loss was minimal.      Non-bleeding external hemorrhoids were found during retroflexion. The       hemorrhoids were medium-sized. Impression:            -  Preparation of the colon was fair.                        - Perianal skin tags found on perianal exam.                        - The examined portion of the ileum was normal.                         Biopsied.                        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 Biopsied.                        - Severe (Mayo Score 3) left-sided ulcerative colitis,                         worsened since the last examination. Biopsied.                        - Inactive (Mayo Score 0) ulcerative colitis, improved                         since the last examination. Biopsied.                         - Non-bleeding external hemorrhoids. Recommendation:        - Discharge patient to home (with escort).                        - Resume previous diet today.                        - Continue present medications.                        - Await pathology results.                        - Return to my office as previously scheduled.                        - Check infliximab trough levels and antibodies Procedure Code(s):     --- Professional ---                        [phone], Colonoscopy, flexible; with biopsy, single or                         multiple Diagnosis Code(s):     --- Professional ---                        E99.371, Left sided colitis with intestinal obstruction                        K64.4, Residual hemorrhoidal skin tags                        K51.012, Ulcerative (chronic) pancolitis with  intestinal obstruction CPT copyright 2019 American Medical Association. All rights reserved. The codes documented in this report are preliminary and upon coder review may  be revised to meet current compliance requirements. Dr. Ulyess Mort Lin Landsman MD, MD 03/27/2019 11:24:13 AM This report has been signed electronically. Number of Addenda: 0 Note Initiated On: 03/27/2019 10:35 AM Scope Withdrawal Time: 0 hours 16 minutes 28 seconds  Total Procedure Duration: 0 hours 29 minutes 10 seconds  Estimated Blood Loss:  Estimated blood loss: none.      [facility]

## 2019-03-27 NOTE — Anesthesia Postprocedure Evaluation (Signed)
Anesthesia Post Note  Patient: Christina Obrien  Procedure(s) Performed: COLONOSCOPY WITH PROPOFOL (N/A )  Patient location during evaluation: Endoscopy Anesthesia Type: General Level of consciousness: awake and alert and oriented Pain management: pain level controlled Vital Signs Assessment: post-procedure vital signs reviewed and stable Respiratory status: spontaneous breathing, nonlabored ventilation and respiratory function stable Cardiovascular status: blood pressure returned to baseline and stable Postop Assessment: no signs of nausea or vomiting Anesthetic complications: no     Last Vitals:  Vitals:   03/27/19 1129 03/27/19 1150  BP: 110/69 117/83  Pulse:    Resp:    Temp:    SpO2:      Last Pain:  Vitals:   03/27/19 1150  TempSrc:   PainSc: 0-No pain                 Christina Obrien

## 2019-03-27 NOTE — Anesthesia Preprocedure Evaluation (Signed)
Anesthesia Evaluation  Patient identified by MRN, date of birth, ID band Patient awake    Reviewed: Allergy & Precautions, H&P , NPO status , Patient's Chart, lab work & pertinent test results  History of Anesthesia Complications Negative for: history of anesthetic complications  Airway Mallampati: II  TM Distance: >3 FB Neck ROM: full    Dental  (+) Chipped   Pulmonary neg shortness of breath, former smoker,           Cardiovascular (-) angina(-) Past MI and (-) DOE negative cardio ROS       Neuro/Psych negative neurological ROS  negative psych ROS   GI/Hepatic Neg liver ROS, PUD, GERD  Medicated and Controlled,  Endo/Other  negative endocrine ROS  Renal/GU negative Renal ROS  negative genitourinary   Musculoskeletal   Abdominal   Peds  Hematology negative hematology ROS (+)   Anesthesia Other Findings Past Medical History: 04/07/2017: Iron deficiency anemia 12/13/2016: Lower abdominal pain No date: Medical history non-contributory 12/13/2016: Postprandial diarrhea 2017: Previous cesarean section     Comment:  non reassuring FHT 12/13/2016: Weight loss  Past Surgical History: 01/06/2016: CESAREAN SECTION; N/A     Comment:  Procedure: CESAREAN SECTION;  Surgeon: Malachy Mood,              MD;  Location: [facility] ORS;  Service: Obstetrics;                Laterality: N/A; 12/27/2016: COLONOSCOPY WITH PROPOFOL; N/A     Comment:  Procedure: COLONOSCOPY WITH PROPOFOL;  Surgeon: Jonathon Bellows, MD;  Location: [facility] ENDOSCOPY;  Service:               Gastroenterology;  Laterality: N/A; 12/27/2016: ESOPHAGOGASTRODUODENOSCOPY (EGD) WITH PROPOFOL; N/A     Comment:  Procedure: ESOPHAGOGASTRODUODENOSCOPY (EGD) WITH               PROPOFOL;  Surgeon: Jonathon Bellows, MD;  Location: [facility]               ENDOSCOPY;  Service: Gastroenterology;  Laterality: N/A;  BMI    Body Mass Index: 23.63 kg/m       Reproductive/Obstetrics negative OB ROS                             Anesthesia Physical Anesthesia Plan  ASA: II  Anesthesia Plan: General   Post-op Pain Management:    Induction: Intravenous  PONV Risk Score and Plan: Propofol infusion and TIVA  Airway Management Planned: Natural Airway and Nasal Cannula  Additional Equipment:   Intra-op Plan:   Post-operative Plan:   Informed Consent: I have reviewed the patients History and Physical, chart, labs and discussed the procedure including the risks, benefits and alternatives for the proposed anesthesia with the patient or authorized representative who has indicated his/her understanding and acceptance.     Dental Advisory Given  Plan Discussed with: Anesthesiologist, CRNA and Surgeon  Anesthesia Plan Comments: (Patient consented for risks of anesthesia including but not limited to:  - adverse reactions to medications - risk of intubation if required - damage to teeth, lips or other oral mucosa - sore throat or hoarseness - Damage to heart, brain, lungs or loss of life  Patient voiced understanding.)        Anesthesia Quick Evaluation

## 2019-03-27 NOTE — Transfer of Care (Signed)
Immediate Anesthesia Transfer of Care Note  Patient: Christina Obrien  Procedure(s) Performed: COLONOSCOPY WITH PROPOFOL (N/A )  Patient Location: Endoscopy Unit  Anesthesia Type:General  Level of Consciousness: drowsy, patient cooperative and responds to stimulation  Airway & Oxygen Therapy: Patient Spontanous Breathing  Post-op Assessment: Report given to RN and Post -op Vital signs reviewed and stable  Post vital signs: Reviewed and stable  Last Vitals:  Vitals Value Taken Time  BP 105/73 03/27/19 1120  Temp    Pulse 94 03/27/19 1120  Resp 16 03/27/19 1120  SpO2 100 % 03/27/19 1120  Vitals shown include unvalidated device data.  Last Pain:  Vitals:   03/27/19 0908  TempSrc: Temporal  PainSc: 0-No pain         Complications: No apparent anesthesia complications

## 2019-03-28 ENCOUNTER — Encounter: Payer: Self-pay | Admitting: *Deleted

## 2019-03-28 ENCOUNTER — Telehealth: Payer: Self-pay

## 2019-03-28 DIAGNOSIS — Z79620 Long term (current) use of immunosuppressive biologic: Secondary | ICD-10-CM

## 2019-03-28 DIAGNOSIS — Z79899 Other long term (current) drug therapy: Secondary | ICD-10-CM

## 2019-03-28 DIAGNOSIS — K5100 Ulcerative (chronic) pancolitis without complications: Secondary | ICD-10-CM

## 2019-03-28 LAB — SURGICAL PATHOLOGY

## 2019-03-28 NOTE — Telephone Encounter (Signed)
Patient verbalized understanding. Patient states she is 4 weeks out from her last infusion. Patient is going to go to [facility] on 03/29/2019 to get labs done

## 2019-03-28 NOTE — Telephone Encounter (Signed)
-----   Message from Lin Landsman, MD sent at 03/28/2019  3:57 PM EST ----- The pathology results from recent colonoscopy do suggest moderately active colitis in her left colon.  I recommend to check infliximab trough levels right before her next infusion or if she is at 4 weeks due for her next infusion  Rohini Vanga

## 2019-03-28 NOTE — Telephone Encounter (Signed)
Called and left a message for call back  

## 2019-04-02 NOTE — Telephone Encounter (Signed)
Called and left a detail message to go get labs

## 2019-04-09 ENCOUNTER — Ambulatory Visit (INDEPENDENT_AMBULATORY_CARE_PROVIDER_SITE_OTHER): Payer: BC Managed Care – PPO | Admitting: Certified Nurse Midwife

## 2019-04-09 ENCOUNTER — Encounter: Payer: Self-pay | Admitting: Certified Nurse Midwife

## 2019-04-09 ENCOUNTER — Other Ambulatory Visit (HOSPITAL_COMMUNITY)
Admission: RE | Admit: 2019-04-09 | Discharge: 2019-04-09 | Disposition: A | Discharge status: Home / Self Care | Payer: BC Managed Care – PPO | Source: Ambulatory Visit | Attending: Certified Nurse Midwife | Admitting: Certified Nurse Midwife

## 2019-04-09 ENCOUNTER — Telehealth: Payer: Self-pay

## 2019-04-09 ENCOUNTER — Other Ambulatory Visit: Payer: Self-pay

## 2019-04-09 VITALS — BP 100/60 | HR 70 | Temp 97.7°F | Ht 64.0 in | Wt 142.0 lb

## 2019-04-09 DIAGNOSIS — Z124 Encounter for screening for malignant neoplasm of cervix: Secondary | ICD-10-CM | POA: Diagnosis present

## 2019-04-09 DIAGNOSIS — K648 Other hemorrhoids: Secondary | ICD-10-CM

## 2019-04-09 DIAGNOSIS — Z01419 Encounter for gynecological examination (general) (routine) without abnormal findings: Secondary | ICD-10-CM | POA: Diagnosis not present

## 2019-04-09 DIAGNOSIS — N72 Inflammatory disease of cervix uteri: Secondary | ICD-10-CM

## 2019-04-09 DIAGNOSIS — K5100 Ulcerative (chronic) pancolitis without complications: Secondary | ICD-10-CM

## 2019-04-09 DIAGNOSIS — Z113 Encounter for screening for infections with a predominantly sexual mode of transmission: Secondary | ICD-10-CM | POA: Insufficient documentation

## 2019-04-09 DIAGNOSIS — Z1239 Encounter for other screening for malignant neoplasm of breast: Secondary | ICD-10-CM

## 2019-04-09 DIAGNOSIS — B977 Papillomavirus as the cause of diseases classified elsewhere: Secondary | ICD-10-CM | POA: Insufficient documentation

## 2019-04-09 LAB — INFLIXIMAB (IFX) CONC+ IFX AB
Anti-Infliximab Antibody: <22 ng/mL
Infliximab Drug Level: 17 ug/mL

## 2019-04-09 NOTE — Telephone Encounter (Signed)
Informed patient of this information. Patient verbalized understanding. Order the lab

## 2019-04-09 NOTE — Telephone Encounter (Signed)
Tried to call patient someone answer but I could not hear them say anything. Will try again later

## 2019-04-09 NOTE — Progress Notes (Signed)
Gynecology Annual Exam  PCP: Dalia Heading, CNM  Chief Complaint:  Chief Complaint  Patient presents with  . Gynecologic Exam    hemorrhoids    History of Present Illness: Christina Obrien is a 39 y.o. [id number] BF who presents for a routine annual exam. The patient complains of hemorrhoidal discomfort/ bleeding when she has frequent BMs.   Her menses are regular, they occur every month, and they last 7 days. Her flow is heavy for 2 days requiring a pad change 6 times a day. . She does not have intermenstrual bleeding. Her last menstrual period was 03/26/2019. She denies dysmenorrhea. 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   The patient is  sexually active. She currently uses condoms for contraception. She does not have dyspareunia.  Since her last visit, she has been diagnosed with ulcerative colitis and is currently on Remicade.  Her past medical history is also remarkable for iron deficiency anemia  The patient does perform self breast exams. Her last mammogram was NA   There is no family history of breast cancer.   There is no family history of ovarian cancer.   The patient is trying to quit smoking. Last smoked 1-2 weeks ago.  She has an occasional glass of wine   She denies illegal drug use.  The patient reports exercising by [place] 9 [place].  The patient denies current symptoms of depression.    She is unsure when she last had her cholesterol checked.  She does get adequate calcium in her diet. Just finished a courae of weekly vitamin D2.  Review of Systems: Review of Systems  Constitutional: Negative for chills, fever and weight loss.       Lost about 40# with the ulcerative colitis. HAs gained about 10# since 12/2018  HENT: Negative for congestion, sinus pain and sore throat.   Eyes: Negative for blurred vision and pain.  Respiratory: Negative for hemoptysis, shortness of  breath and wheezing.   Cardiovascular: Negative for chest pain, palpitations and leg swelling.  Gastrointestinal: Positive for diarrhea. Negative for abdominal pain, blood in stool, heartburn, nausea and vomiting.       Hemorrhoidal discomfort and bleeding  Genitourinary: Negative for dysuria, frequency, hematuria and urgency.  Musculoskeletal: Negative for back pain, joint pain and myalgias.  Skin: Negative for itching and rash.  Neurological: Negative for dizziness, tingling and headaches.  Endo/Heme/Allergies: Negative for environmental allergies and polydipsia. Does not bruise/bleed easily.       Negative for hirsutism   Psychiatric/Behavioral: Negative for depression. The patient is not nervous/anxious and does not have insomnia.     Past Medical History:  Past Medical History:  Diagnosis Date  . Iron deficiency anemia 04/07/2017  . Lower abdominal pain 12/13/2016  . Postprandial diarrhea 12/13/2016  . Previous cesarean section 2017   non reassuring FHT  . Ulcerative colitis ([place])   . Weight loss 12/13/2016    Past Surgical History:  Past Surgical History:  Procedure Laterality Date  . CESAREAN SECTION N/A 01/06/2016   Procedure: CESAREAN SECTION;  Surgeon: Malachy Mood, MD;  Location: [facility] ORS;  Service: Obstetrics;  Laterality: N/A;  . COLONOSCOPY WITH PROPOFOL N/A 12/27/2016   Procedure: COLONOSCOPY WITH PROPOFOL;  Surgeon: Jonathon Bellows, MD;  Location: [facility] ENDOSCOPY;  Service: Gastroenterology;  Laterality: N/A;  . COLONOSCOPY WITH PROPOFOL N/A 03/27/2019   Procedure: COLONOSCOPY WITH PROPOFOL;  Surgeon: Lin Landsman, MD;  Location: [facility] ENDOSCOPY;  Service: Gastroenterology;  Laterality: N/A;  . ESOPHAGOGASTRODUODENOSCOPY (EGD) WITH PROPOFOL N/A 12/27/2016   Procedure: ESOPHAGOGASTRODUODENOSCOPY (EGD) WITH PROPOFOL;  Surgeon: Jonathon Bellows, MD;  Location: [facility] ENDOSCOPY;  Service: Gastroenterology;  Laterality: N/A;    Family History:  Family History  Problem  Relation Age of Onset  . Diabetes Maternal Grandmother   . Liver cancer Maternal Grandfather   . Colitis Paternal Grandmother   . Stroke Neg Hx   . Breast cancer Neg Hx   . Ovarian cancer Neg Hx     Social History:  Social History   Socioeconomic History  . Marital status: Single    Spouse name: Not on file  . Number of children: 2  . Years of education: Not on file  . Highest education level: Not on file  Occupational History  . Not on file  Tobacco Use  . Smoking status: Former Smoker    Types: Cigarettes    Quit date: 11/24/2016    Years since quitting: 2.3  . Smokeless tobacco: Never Used  . Tobacco comment: has not smoked in 1-2 weeks  Substance and Sexual Activity  . Alcohol use: Yes    Comment: occasional glass of wine  . Drug use: Never  . Sexual activity: Yes    Partners: Male    Birth control/protection: None, Condom    Comment: occasional condoms  Other Topics Concern  . Not on file  Social History Narrative  . Not on file   Social Determinants of Health   Financial Resource Strain:   . Difficulty of Paying Living Expenses:   Food Insecurity:   . Worried About Charity fundraiser in the Last Year:   . Arboriculturist in the Last Year:   Transportation Needs:   . Film/video editor (Medical):   Marland Kitchen Lack of Transportation (Non-Medical):   Physical Activity:   . Days of Exercise per Week:   . Minutes of Exercise per Session:   Stress:   . Feeling of Stress :   Social Connections:   . Frequency of Communication with Friends and Family:   . Frequency of Social Gatherings with Friends and Family:   . Attends Religious Services:   . Active Member of Clubs or Organizations:   . Attends Archivist Meetings:   Marland Kitchen Marital Status:   Intimate Partner Violence:   . Fear of Current or Ex-Partner:   . Emotionally Abused:   Marland Kitchen Physically Abused:   . Sexually Abused:     Allergies:  No Known Allergies  Medications: Prior to Admission  medications   Medication Sig Start Date End Date Taking? Authorizing Provider  inFLIXimab (REMICADE) 100 MG injection Inject 100 mg into the vein every 8 (eight) weeks.   Yes [provider]  Vitamin D, Ergocalciferol, (DRISDOL) 1.25 MG (50000 UT) CAPS capsule Take 1 capsule (50,000 Units total) by mouth every 7 (seven) days. 12/31/18  Yes Vanga, Tally Due, MD  Daily multivitamin  Physical Exam Vitals: BP 100/60   Pulse 70   Temp 97.7 F (36.5 C)   Ht 5' 4"  (1.626 m)   Wt 142 lb (64.4 kg)   LMP 03/26/2019 (Exact Date)   BMI 24.37 kg/m   General: BF in NAD HEENT: normocephalic, anicteric Neck: no thyroid enlargement, no palpable nodules, no cervical lymphadenopathy  Pulmonary: No increased work of breathing, CTAB Cardiovascular: RRR, without murmur  Breast: Breast symmetrical, no tenderness, no palpable nodules or masses, no skin or  nipple retraction present, no nipple discharge.  No axillary, infraclavicular or supraclavicular lymphadenopathy. Abdomen: Soft, non-tender, non-distended.  Umbilicus without lesions.  No hepatomegaly or masses palpable. No evidence of hernia. Genitourinary:  External: Normal external female genitalia.  Normal urethral meatus, normal Bartholin's and Skene's glands.    Vagina: Normal vaginal mucosa, no evidence of prolapse.    Cervix: Grossly normal in appearance, no bleeding, non-tender  Uterus: Anteverted, normal size, shape, and consistency, mobile, and non-tender  Adnexa: No adnexal masses, non-tender  Rectal: rosebud , not thrombosed or inflamedshaped external hemorrhoid  Lymphatic: no evidence of inguinal lymphadenopathy Extremities: no edema, erythema, or tenderness Neurologic: Grossly intact Psychiatric: mood appropriate, affect full     Assessment: 39 y.o. [id number] normal gyn exam External hemorrhoids + HRHPV on last Pap smear Plan:    1) Breast cancer screening - recommend monthly self breast exam and annual screening  mammograms Mammogram was ordered today. Patient to schedule  2) DIscussed hemorrhoid treatment like sitz baths, prep H wipes, Prep H or annusol. Also encouraged talking with GI physician about other treatments  3) Cervical cancer screening - Pap was done. CHlamydia and GC also ordered per patient request.  4) Contraception - Wishes to continue with condoms  5) Routine healthcare maintenance including cholesterol and diabetes screening. Discuss having drawn when her next labs are ordered  6) Discussed calcium and vitamin D3 recommendations. Start 1000 IU vitamin D3 daily  7) RTO pending Pap smear results.  Dalia Heading, CNM

## 2019-04-09 NOTE — Telephone Encounter (Signed)
-----   Message from Lin Landsman, MD sent at 04/09/2019  4:04 PM EDT ----- Please inform patient that she has adequate Remicade levels in her system through midcycle at 4 weeks. However, recommend to recheck Remicade levels right before her next infusion. Please order infliximab drug levels  Rohini Vanga

## 2019-04-11 ENCOUNTER — Encounter: Payer: Self-pay | Admitting: Certified Nurse Midwife

## 2019-04-11 LAB — CYTOLOGY - PAP
Chlamydia: NEGATIVE
Comment: NEGATIVE
Comment: NEGATIVE
Comment: NORMAL
Diagnosis: NEGATIVE
High risk HPV: NEGATIVE
Neisseria Gonorrhea: NEGATIVE

## 2019-10-31 ENCOUNTER — Telehealth: Payer: Self-pay

## 2019-10-31 NOTE — Telephone Encounter (Signed)
We can switch to inflectra  RV

## 2019-10-31 NOTE — Telephone Encounter (Signed)
Per Sharrie Rothman Her insurance is saying remicade no longer preferred product and will need to switch to avsola or Inflectra. Please advised if this okay to change and which one do you want to change to

## 2019-10-31 NOTE — Telephone Encounter (Signed)
Informed Christina Obrien of the switch and also faxed back form that needed to be sign and fax back of the switch

## 2019-10-31 NOTE — Telephone Encounter (Signed)
Called patient and made patient a follow up appointment because she is due to see you. Appointment is scheduled for 11/11/2019

## 2019-11-11 ENCOUNTER — Telehealth: Payer: Self-pay

## 2019-11-11 ENCOUNTER — Encounter: Payer: Self-pay | Admitting: Gastroenterology

## 2019-11-11 ENCOUNTER — Ambulatory Visit (INDEPENDENT_AMBULATORY_CARE_PROVIDER_SITE_OTHER): Payer: 59 | Admitting: Gastroenterology

## 2019-11-11 ENCOUNTER — Other Ambulatory Visit: Payer: Self-pay

## 2019-11-11 VITALS — BP 100/63 | HR 89 | Temp 98.3°F | Ht 64.0 in | Wt 144.4 lb

## 2019-11-11 DIAGNOSIS — K5100 Ulcerative (chronic) pancolitis without complications: Secondary | ICD-10-CM | POA: Diagnosis not present

## 2019-11-11 NOTE — Progress Notes (Signed)
Christina Darby, MD [address]  [place]  [place], [place] [address]  Main: [phone]  Fax: [phone]    Gastroenterology Consultation  Referring Provider:     No ref. provider found Primary Care Physician:  Dalia Heading, CNM (Inactive) Primary Gastroenterologist:  Dr. Cephas Obrien Reason for Consultation:   Panulcerative colitis        HPI:   Christina Obrien is a 39 y.o. female [facility]  for consultation & management of chronic diarrhea and weight loss.   Onset of diarrhea and weight loss since early part of summer Nonbloody diarrhea, post prandial, 55mn after eating, lower abdominal cramps, gassy.  She denies nocturnal diarrhea, urgency or incontinence.  She denies epigastric pain, nausea, vomiting.  She used to have regular bowel movements about 1 year ago. Had delivered healthy baby in dec 2017, underwent C-section, she started loosing weight since march/april. She had intermittent loose stools during pregnancy also.  She denies travel outside [place] camping or using antibiotics, NSAIDs or sick contacts She has h/o anemia, first detected in 12/2015 and recently found to have microcytic anemia.  She is currently taking women's multivitamin daily She was also losing weight before pregnancy. Baseline weight is 150-160s.  Today she weighs about 126 pounds She reports that she was tested for HIV at the time of pregnancy and it was negative, TSH was normal, CMP was normal Used to smoke cigarettes regularly 1pack last for 2-3days since early 20s Stopped in pregnancy Restarted after delivery, cut it back since onset of symptoms as she felt smoking made her symptoms worse She has not tried any medications for diarrhea or abdominal cramps  Follow-up visit 04/03/2017: Patient underwent upper endoscopy and colonoscopy. Upper endoscopy revealed nonspecific gastritis. There was no evidence of H. Pylori. Colonoscopy revealed mild inflammation  in the entire colon from rectum to cecum. Terminal ileum was not examined. Pathology revealed mild active colitis including rectum. Patient is here for follow-up visit today. She continues to feel weak, lost about 4 pounds Her GI symptoms remain unchanged. She is taking women's multivitamin and alfa-alfa supplements daily. She reports not taking NSAIDs since the GI procedures as Dr. AVicente Malesrecommended her to stop NSAIDs and thought her inflammation could be NSAID related injury. She continues to smoke, 1 pack last for 2 days  Follow-up visit 10/30/2017: She was taking mesalamine 2.4 g daily for few weeks, she felt better.  Then, she stopped taking mesalamine by herself because she thought it was not completely alleviating her symptoms.  She now presents for follow-up with worsening of symptoms including abdominal pain, nausea, vomiting, postprandial diarrhea, weight loss.  She did not follow up with me in last 7 months.  She thinks something else is going on leading to weight loss other than ulcerative colitis.  She smokes about 2 cigarettes/day.  She said she has been busy with job and kids and did not pay much attention to herself.  She did not follow-up with hematology for iron deficiency anemia.  She states she received for iron infusions since 03/2017.  She denies fever, chills.  She does see intermittent blood in stools.  Follow up visit 11/27/17: She reports feeling fairly well today.  Her GI symptoms have resolved.  Patient started Remicade in 10/2017, received first induction dose.  Scheduled to receive second induction dose today.  She is currently taking prednisone 40 mg daily.  Patient gained 15 pounds since last visit  Follow up visit  03/29/2018: Remicade induction 1st dose in 10/2017 , 2nd dose on 11/27/2017,  3rd dose in 12/2018 First maintenance infusion was on January 30th 2020 She reports tolerating Remicade infusion well.  She is currently not on mesalamine or azathioprine.  Taking a  multivitamin daily She gained about 15 pounds since last visit.  She reports doing well.  She denies any GI symptoms today. She is going back to work.  She denies drinking alcohol or smoking tobacco  Follow-up visit 12/27/2018 Patient is here to discuss about continuation of Remicade as she could not get her Remicade renewed since she switched her insurance in end of September.  Her last dose of Remicade was in end of July.  For the last 3 weeks or so, she has been experiencing recurrence of her colitis symptoms including severe diarrhea, lower abdominal discomfort associated with weight loss, nausea and vomiting.  She denies subjective fevers, chills.  Patient became tearful that Remicade was working so well and now she has recurrence of symptoms and unable to work.  She feels very weak.  She did not undergo colonoscopy and I recommended and she did not follow-up with me for more than 6 months.  Follow-up televisit 03/15/2019 Christina Obrien is feeling well today.  Since last visit, we have restarted her Remicade after a gap of about 6 months due to change in her insurance.  She reports feeling normal, her symptoms have resolved.  Energy levels have improved.  She is taking only vitamin D and multivitamin in addition to Remicade  Follow-up visit 11/11/2019 Christina Obrien is feeling well today.  She does not have any complaints.  Her insurance has been changed effective September who recommended a bio similar, I chose Inflectra.  She is past due by 4 weeks. Patient is currently waiting for this infusion to be scheduled.  Her weight is stable, gained few pounds.  Reports having 1 bowel movement daily.  She reports getting labs done with every other infusion.  She denies fatigue.  She is not taking any vitamin D supplements.  She did not receive Covid shot.  Antiplts/Anticoagulants/Anti thrombotics: none  NSAIDs: she used to take ibuprofen about once a week or less for occasional headaches She denies taking any  over-the-counter supplements other than multivitamin   GI Procedures:  - Preparation of the colon was fair. - Perianal skin tags found on perianal exam. - The examined portion of the ileum was normal. Biopsied. 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 Biopsied. - Severe (Mayo Score 3) left-sided ulcerative colitis, worsened since the last examination. Biopsied. - Inactive (Mayo Score 0) ulcerative colitis, improved since the last examination. Biopsied. - Non-bleeding external hemorrhoids.   EGD and colonoscopy on 12/27/2016 by Dr. Vicente Males The esophagus was normal. Patchy moderate inflammation characterized by adherent blood, congestion (edema), erosions and erythema was found in the gastric antrum. Biopsies were taken with a cold forceps for histology. Two non-bleeding superficial gastric ulcers with no stigmata of bleeding were found in the stomach. The largest lesion was 4 mm in largest dimension. Biopsies were taken with a cold forceps for histology. Duodenum was inflamed  Colonoscopy - Preparation of the colon was poor. - Patchy moderate inflammation was found in the entire examined colon secondary to colitis. Biopsied. - One 6 mm polyp in the ascending colon, removed with a cold biopsy forceps. Resected and retrieved. - The examination was otherwise normal on direct and retroflexion views. DIAGNOSIS:  A. DUODENAL BULB; COLD BIOPSY:  - MILD NONSPECIFIC CHRONIC DUODENITIS  WITH INTACT VILLI.  - NEGATIVE FOR ACTIVE INFLAMMATION, INTRAEPITHELIAL LYMPHOCYTOSIS, AND  INFECTIOUS AGENTS.   B. DUODENUM, THIRD PORTION; COLD BIOPSY:  - DUODENAL MUCOSA WITH INTACT VILLI.  - NEGATIVE FOR ACTIVE INFLAMMATION, INTRAEPITHELIAL LYMPHOCYTOSIS, AND  INFECTIOUS AGENTS.   C. STOMACH ADJACENT TO ULCER; COLD BIOPSY:  - ANTRAL-TYPE MUCOSA WITH MILD CHRONIC GASTRITIS, SEE COMMENT.  - REACTIVE FOVEOLAR HYPERPLASIA.  - NEGATIVE FOR HELICOBACTER PYLORI BY IMMUNOHISTOCHEMISTRY (IHC).   Comment:  There  are a few foci of gastric glandular injury surrounded by  lymphocytes, eosinophils, and macrophages. Neutrophils are not  identified. This focally enhanced gastritis pattern is not specific in  adults but can be a medication effect, and can be seen in inflammatory  bowel disease. No granulomas or viral inclusions are seen. One tiny  crushed mucosal fragment shows a few goblet cells but I cannot be sure  of the type of tissue. Control for H pylori IHC stained appropriately.   D. RIGHT COLON; RANDOM COLD BIOPSY:  - MILD ACTIVE COLITIS WITH FEATURES OF CHRONICITY.   E. COLON POLYP, ASCENDING; COLD BIOPSY:  - CHRONIC ULCER IN A BACKGROUND OF ACTIVE COLITIS, SEE COMMENT.   F. TRANSVERSE COLON; RANDOM COLD BIOPSY:  - MILD ACTIVE COLITIS WITH FEATURES OF CHRONICITY.   G. SIGMOID COLON; RANDOM COLD BIOPSY:  - MILD ACTIVE COLITIS WITH FEATURES OF CHRONICITY.   H. RECTUM; RANDOM COLD BIOPSY:  - MILD ACTIVE PROCTITIS WITH FEATURES OF CHRONICITY.   Comment:  All of the samples are negative for dysplasia and malignancy.   Colonoscopy 03/27/19 - Preparation of the colon was fair. - Perianal skin tags found on perianal exam. - The examined portion of the ileum was normal. Biopsied. 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 Biopsied. - Severe (Mayo Score 3) left-sided ulcerative colitis, worsened since the last examination. Biopsied. - Inactive (Mayo Score 0) ulcerative colitis, improved since the last examination. Biopsied. - Non-bleeding external hemorrhoids. DIAGNOSIS:  A. TERMINAL ILEUM; COLD BIOPSY:  - ENTERIC MUCOSA WITH NORMAL VILLOUS ARCHITECTURE AND NO SIGNIFICANT  HISTOPATHOLOGIC CHANGE.  - NEGATIVE FOR ACTIVE MUCOSAL ENTERITIS.  - NEGATIVE FOR GRANULOMA, DYSPLASIA, AND MALIGNANCY.   B. COLON, RIGHT; COLD BIOPSY:  - CHRONIC COLITIS WITHOUT MUCOSAL ACTIVITY.  - NEGATIVE FOR GRANULOMA, DYSPLASIA, AND MALIGNANCY.   C. COLON, TRANSVERSE; COLD BIOPSY:  - CHRONIC COLITIS WITHOUT MUCOSAL  ACTIVITY.  - NEGATIVE FOR GRANULOMA, DYSPLASIA, AND MALIGNANCY.   D. COLON, LEFT; COLD BIOPSY:  - CHRONIC COLITIS WITHOUT MUCOSAL ACTIVITY.  - NEGATIVE FOR GRANULOMA, DYSPLASIA, AND MALIGNANCY.   E. COLON, RECTOSIGMOID STRICTURE; COLD BIOPSY:  - CHRONIC PROCTO-COLITIS WITH MODERATE ACTIVITY (CRYPTITIS AND FOCAL  CRYPT ABSCESS; NO DEFINITE ULCERATION OR CRYPT DROPOUT).  - NEGATIVE FOR GRANULOMA, DYSPLASIA, AND MALIGNANCY.   F. RECTUM; COLD BIOPSY:  - CHRONIC PROCTITIS WITH MILD ACTIVITY (CRYPTITIS).  - NEGATIVE FOR GRANULOMA, DYSPLASIA, AND MALIGNANCY.   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 Iron deficiency anemia 04/07/2017  . Lower abdominal pain 12/13/2016  . Postprandial diarrhea 12/13/2016  . Previous cesarean section 2017   non reassuring FHT  . Ulcerative colitis ([place])   . Weight loss 12/13/2016    Past Surgical History:  Procedure Laterality Date  . CESAREAN SECTION N/A 01/06/2016   Procedure: CESAREAN SECTION;  Surgeon: Malachy Mood, MD;  Location: [facility]  ORS;  Service: Obstetrics;  Laterality: N/A;  . COLONOSCOPY WITH PROPOFOL N/A 12/27/2016   Procedure: COLONOSCOPY WITH PROPOFOL;  Surgeon: Jonathon Bellows, MD;  Location: [facility] ENDOSCOPY;  Service: Gastroenterology;  Laterality: N/A;  . COLONOSCOPY WITH PROPOFOL N/A 03/27/2019   Procedure: COLONOSCOPY WITH PROPOFOL;  Surgeon: Lin Landsman, MD;  Location[facility] ENDOSCOPY;  Service: Gastroenterology;  Laterality: N/A;  . ESOPHAGOGASTRODUODENOSCOPY (EGD) WITH PROPOFOL N/A 12/27/2016   Procedure: ESOPHAGOGASTRODUODENOSCOPY (EGD) WITH PROPOFOL;  Surgeon: Jonathon Bellows, MD;  Location: [facility] ENDOSCOPY;  Service: Gastroenterology;  Laterality: N/A;    Current Outpatient Medications:  .  inFLIXimab (REMICADE) 100 MG  injection, Inject 100 mg into the vein every 8 (eight) weeks., Disp: , Rfl:     Family History  Problem Relation Age of Onset  . Diabetes Maternal Grandmother   . Liver cancer Maternal Grandfather   . Colitis Paternal Grandmother   . Stroke Neg Hx   . Breast cancer Neg Hx   . Ovarian cancer Neg Hx      Social History   Tobacco Use  . Smoking status: Former Smoker    Types: Cigarettes    Quit date: 11/24/2016    Years since quitting: 2.9  . Smokeless tobacco: Never Used  . Tobacco comment: has not smoked in 1-2 weeks  Vaping Use  . Vaping Use: Never used  Substance Use Topics  . Alcohol use: Yes    Comment: occasional glass of wine  . Drug use: Never    Allergies as of 11/11/2019  . (No Known Allergies)    Review of Systems:    All systems reviewed and negative except where noted in HPI.   Physical Exam:  BP 100/63 (BP Location: Left Arm, Patient Position: Sitting, Cuff Size: Normal)   Pulse 89   Temp 98.3 F (36.8 C) (Oral)   Ht _0  (1.626 m)   Wt 144 lb 6 oz (65.5 kg)   BMI 24.78 kg/m  No LMP recorded.  General:   Alert, well-built, pleasant and cooperative in NAD Head:  Normocephalic and atraumatic. Eyes:  Sclera clear, no icterus.   Conjunctiva pink. Ears:  Normal auditory acuity. Nose:  No deformity, discharge, or lesions. Mouth:  No deformity or lesions,oropharynx pink & moist. Neck:  Supple; no masses or thyromegaly. Lungs:  Respirations even and unlabored.  Clear throughout to auscultation.   No wheezes, crackles, or rhonchi. No acute distress. Heart:  Regular rate and rhythm; no murmurs, clicks, rubs, or gallops. Abdomen:  Normal bowel sounds. Soft, nontender and non-distended without masses, hepatosplenomegaly or hernias noted.  No guarding or rebound tenderness.   Rectal: Nor performed Msk:  Symmetrical without gross deformities. Good, equal movement & strength bilaterally. Pulses:  Normal pulses noted. Extremities:  No clubbing or edema.  No  cyanosis. Neurologic:  Alert and oriented x3;  grossly normal neurologically. Skin:  Intact without significant lesions or rashes. No jaundice. Psych:  Alert and cooperative. Normal mood and affect.  Imaging Studies: None  Assessment and Plan:   Christina Obrien is a 39 y.o. African-American female is here follow-up ulcerative pancolitis.  She had significantly elevated fecal calprotectin 475, ESR 72. Her stool studies were negative for infection.  Fecal calprotectin levels were elevated at 475.  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 Anemia: History of severe iron deficiency anemia, status post parenteral iron therapy, anemia resolved.  Ferritin and B12 levels restored.  Recheck CBC today 3.Immunizations: Hep A and B negative serologies, received Twinrix vaccine 2 doses so far, administer second booster today, Influenza recommend but patient does not want flu vaccine, prevnar received in 10/2017, pneumovax received in 12/2018, varicella-had chickenpox as a child, Zoster at age 81 4.Cancer screening I) Colon cancer/dysplasia surveillance:  Colonoscopy in 12/2016, no dysplasia, colonoscopy in 03/2018, no dysplasia II) Cervical cancer: Annual Pap smear negative to date III) Skin cancer - counseled about annual skin exam by dermatology and skin protection in summer using sun screen SPF > 50, clothing 5.Bone health Vitamin D status: Low vitamin D, 17 in 10/2017, recheck levels today.  Treated in the past Bone density testing:  Not done 5.  Labs:  today 6. Smoking: quit smoking 7. NSAIDs and Antibiotics use: continue to avoid   Follow up in 3 months    Christina Darby, MD

## 2019-11-11 NOTE — Telephone Encounter (Signed)
Per Sharrie Rothman: I am following up with pharmacy but looks like her infusion was 8/3 so was due 9/28  which was when we found out her insurance wouldnt pay for remicade.  It has been submitted back to her insurance and I have asked them to escalade to get approved.  All the dosing is the same and change was based on insurance request so I dont see why there is a delay.  As soon as pharmacy follows up, I will let you know a more detailed response.

## 2019-11-11 NOTE — Telephone Encounter (Signed)
Sent a email to [place] with [facility] infusion to find out what is happening with patient inflectra. Patient states she is 4 weeks late on medication.

## 2019-11-12 ENCOUNTER — Telehealth: Payer: Self-pay

## 2019-11-12 ENCOUNTER — Other Ambulatory Visit: Payer: Self-pay | Admitting: Gastroenterology

## 2019-11-12 LAB — CBC
Hematocrit: 35.9 % (ref 34.0–46.6)
Hemoglobin: 11.7 g/dL (ref 11.1–15.9)
MCH: 28.5 pg (ref 26.6–33.0)
MCHC: 32.6 g/dL (ref 31.5–35.7)
MCV: 87 fL (ref 79–97)
Platelets: 200 10*3/uL (ref 150–450)
RBC: 4.11 x10E6/uL (ref 3.77–5.28)
RDW: 12.1 % (ref 11.7–15.4)
WBC: 9.8 10*3/uL (ref 3.4–10.8)

## 2019-11-12 LAB — IRON,TIBC AND FERRITIN PANEL
Ferritin: 25 ng/mL (ref 15–150)
Iron Saturation: 23 % (ref 15–55)
Iron: 61 ug/dL (ref 27–159)
Total Iron Binding Capacity: 269 ug/dL (ref 250–450)
UIBC: 208 ug/dL (ref 131–425)

## 2019-11-12 LAB — C-REACTIVE PROTEIN: CRP: 1 mg/L (ref 0–10)

## 2019-11-12 LAB — COMPREHENSIVE METABOLIC PANEL
ALT: 10 IU/L (ref 0–32)
AST: 15 IU/L (ref 0–40)
Albumin/Globulin Ratio: 1.4 (ref 1.2–2.2)
Albumin: 4.2 g/dL (ref 3.8–4.8)
Alkaline Phosphatase: 64 IU/L (ref 44–121)
BUN/Creatinine Ratio: 16 (ref 9–23)
BUN: 15 mg/dL (ref 6–24)
Bilirubin Total: <0.2 mg/dL (ref 0.0–1.2)
CO2: 23 mmol/L (ref 20–29)
Calcium: 9.3 mg/dL (ref 8.7–10.2)
Chloride: 102 mmol/L (ref 96–106)
Creatinine, Ser: 0.93 mg/dL (ref 0.57–1.00)
GFR calc Af Amer: 88 mL/min/{1.73_m2} (ref >59)
GFR calc non Af Amer: 77 mL/min/{1.73_m2} (ref >59)
Globulin, Total: 3.1 g/dL (ref 1.5–4.5)
Glucose: 89 mg/dL (ref 65–99)
Potassium: 3.8 mmol/L (ref 3.5–5.2)
Sodium: 139 mmol/L (ref 134–144)
Total Protein: 7.3 g/dL (ref 6.0–8.5)

## 2019-11-12 LAB — VITAMIN D 25 HYDROXY (VIT D DEFICIENCY, FRACTURES): Vit D, 25-Hydroxy: 18.6 ng/mL — ABNORMAL LOW (ref 30.0–100.0)

## 2019-11-12 MED ORDER — VITAMIN D (ERGOCALCIFEROL) 1.25 MG (50000 UNIT) PO CAPS: 50000.0000 [IU] | ORAL_CAPSULE | ORAL | 0 refills | Status: DC

## 2019-11-12 NOTE — Telephone Encounter (Signed)
-----   Message from Lin Landsman, MD sent at 11/12/2019  4:30 PM EDT ----- Low vitamin D levels, recommend vitamin D capsules 50,000 units weekly for 8 weeks.  Low ferritin was, recommend oral iron 1 pill a day with food for 3 months  RV

## 2019-11-12 NOTE — Telephone Encounter (Signed)
Patient verbalized understanding of results. Sent medication to the pharmacy for vitamin d. Informed patient the update on her infusion

## 2019-11-13 NOTE — Telephone Encounter (Signed)
Per Sharrie Rothman approved and medication is getting shipped tomorrow! Nurse will call her to arrange visit

## 2019-11-14 LAB — CALPROTECTIN, FECAL: Calprotectin, Fecal: 483 ug/g — ABNORMAL HIGH (ref 0–120)

## 2019-11-15 ENCOUNTER — Telehealth: Payer: Self-pay

## 2019-11-15 NOTE — Telephone Encounter (Signed)
-----   Message from Lin Landsman, MD sent at 11/15/2019 10:01 AM EDT ----- Her calprotectin levels are significantly elevated.  Any update on Inflectra infusion?  ThanksRV

## 2019-11-15 NOTE — Telephone Encounter (Signed)
On 11/13/2019 the medication was approved and medication was going to be shipped on 11/14/2019. Called patient and patient states she is going to do the infusion on 10/25 or 11/19/2019

## 2020-01-30 ENCOUNTER — Ambulatory Visit: Payer: 59 | Admitting: Gastroenterology

## 2020-02-13 ENCOUNTER — Encounter: Payer: Self-pay | Admitting: Gastroenterology

## 2020-03-09 ENCOUNTER — Encounter: Payer: Self-pay | Admitting: Gastroenterology

## 2020-03-09 ENCOUNTER — Ambulatory Visit (INDEPENDENT_AMBULATORY_CARE_PROVIDER_SITE_OTHER): Payer: 59 | Admitting: Gastroenterology

## 2020-03-09 ENCOUNTER — Other Ambulatory Visit: Payer: Self-pay

## 2020-03-09 VITALS — BP 107/71 | HR 73 | Temp 98.1°F | Wt 149.5 lb

## 2020-03-09 DIAGNOSIS — K5100 Ulcerative (chronic) pancolitis without complications: Secondary | ICD-10-CM

## 2020-03-09 MED ORDER — NA SULFATE-K SULFATE-MG SULF 17.5-3.13-1.6 GM/177ML PO SOLN: 354.0000 mL | Freq: Once | ORAL | 0 refills | Status: AC

## 2020-03-09 NOTE — Progress Notes (Signed)
[name], MD [address]  [place]  [place], [place] [address]  Main: [phone]  Fax: [phone]    Gastroenterology Consultation  Referring Provider:     No ref. provider found Primary Care Physician:  Dalia Heading, CNM (Inactive) Primary Gastroenterologist:  Dr. [name] Reason for Consultation:   Panulcerative colitis        HPI:   Christina Obrien is a 40 y.o. female referred by [facility]  for consultation & management of panulcerative colitis.  Patient is diagnosed with ulcerative colitis in 12/2016, previously maintained on mesalamine, later switched to Remicade due to progression of disease.  Patient is currently maintained on Inflectra 5 mg/kg body weight every 8 weeks.  Patient received 3 infusions of Inflectra since October 2021, last dose today.  Patient reports that she has been tolerating it well.  She does report intermittent constipation.  She has been gaining weight.  She denies abdominal pain, nausea, rectal bleeding.  She does not have any GI concerns today  Antiplts/Anticoagulants/Anti thrombotics: none  NSAIDs: she used to take ibuprofen about once a week or less for occasional headaches She denies taking any over-the-counter supplements other than multivitamin   GI Procedures:  EGD and colonoscopy on 12/27/2016 by Dr. Vicente Males The esophagus was normal. Patchy moderate inflammation characterized by adherent blood, congestion (edema), erosions and erythema was found in the gastric antrum. Biopsies were taken with a cold forceps for histology. Two non-bleeding superficial gastric ulcers with no stigmata of bleeding were found in the stomach. The largest lesion was 4 mm in largest dimension. Biopsies were taken with a cold forceps for histology. Duodenum was inflamed  Colonoscopy - Preparation of the colon was poor. - Patchy moderate inflammation was found in the entire examined colon secondary to colitis. Biopsied. - One 6 mm  polyp in the ascending colon, removed with a cold biopsy forceps. Resected and retrieved. - The examination was otherwise normal on direct and retroflexion views. DIAGNOSIS:  A. 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  - NEGATIVE FOR ACTIVE INFLAMMATION, INTRAEPITHELIAL LYMPHOCYTOSIS, AND  INFECTIOUS AGENTS.   B. DUODENUM, THIRD PORTION; COLD BIOPSY:  - DUODENAL MUCOSA WITH INTACT VILLI.  - NEGATIVE FOR ACTIVE INFLAMMATION, INTRAEPITHELIAL LYMPHOCYTOSIS, AND  INFECTIOUS AGENTS.   C. STOMACH ADJACENT TO ULCER; COLD BIOPSY:  - ANTRAL-TYPE MUCOSA WITH MILD CHRONIC GASTRITIS, SEE COMMENT.  - REACTIVE FOVEOLAR HYPERPLASIA.  - NEGATIVE FOR HELICOBACTER PYLORI BY IMMUNOHISTOCHEMISTRY (I[facility]).   Comment:  There are a few foci of gastric glandular injury surrounded by  lymphocytes, eosinophils, and macrophages. Neutrophils are not  identified. This focally enhanced gastritis pattern is not specific in  adults but can be a medication effect, and can be seen in inflammatory  bowel disease. No granulomas or viral inclusions are seen. One tiny  crushed mucosal fragment shows a few goblet cells but I cannot be sure  of the type of tissue. Control for H pylori IHC stained appropriately.   D. RIGHT COLON; RANDOM COLD BIOPSY:  - MILD ACTIVE COLITIS WITH FEATURES OF CHRONICITY.   E. COLON POLYP, ASCENDING; COLD BIOPSY:  - CHRONIC ULCER IN A BACKGROUND OF ACTIVE COLITIS, SEE COMMENT.   F. TRANSVERSE COLON; RANDOM COLD BIOPSY:  - MILD ACTIVE COLITIS WITH FEATURES OF CHRONICITY.   G. SIGMOID COLON; RANDOM COLD BIOPSY:  - MILD ACTIVE COLITIS WITH FEATURES OF CHRONICITY.   H. RECTUM; RANDOM COLD BIOPSY:  - MILD ACTIVE PROCTITIS WITH  FEATURES OF CHRONICITY.   Comment:  All of the samples are negative for dysplasia and malignancy.   Colonoscopy 03/27/19 - Preparation of the colon was fair. - Perianal skin tags found on perianal exam. - The examined  portion of the ileum was normal. Biopsied. 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 Biopsied. - Severe (Mayo Score 3) left-sided ulcerative colitis, worsened since the last examination. Biopsied. - Inactive (Mayo Score 0) ulcerative colitis, improved since the last examination. Biopsied. - Non-bleeding external hemorrhoids. DIAGNOSIS:  A. TERMINAL ILEUM; COLD BIOPSY:  - ENTERIC MUCOSA WITH NORMAL VILLOUS ARCHITECTURE AND NO SIGNIFICANT  HISTOPATHOLOGIC CHANGE.  - NEGATIVE FOR ACTIVE MUCOSAL ENTERITIS.  - NEGATIVE FOR GRANULOMA, DYSPLASIA, AND MALIGNANCY.   B. COLON, RIGHT; COLD BIOPSY:  - CHRONIC COLITIS WITHOUT MUCOSAL ACTIVITY.  - NEGATIVE FOR GRANULOMA, DYSPLASIA, AND MALIGNANCY.   C. COLON, TRANSVERSE; COLD BIOPSY:  - CHRONIC COLITIS WITHOUT MUCOSAL ACTIVITY.  - NEGATIVE FOR GRANULOMA, DYSPLASIA, AND MALIGNANCY.   D. COLON, LEFT; COLD BIOPSY:  - CHRONIC COLITIS WITHOUT MUCOSAL ACTIVITY.  - NEGATIVE FOR GRANULOMA, DYSPLASIA, AND MALIGNANCY.   E. COLON, RECTOSIGMOID STRICTURE; COLD BIOPSY:  - CHRONIC PROCTO-COLITIS WITH MODERATE ACTIVITY (CRYPTITIS AND FOCAL  CRYPT ABSCESS; NO DEFINITE ULCERATION OR CRYPT DROPOUT).  - NEGATIVE FOR GRANULOMA, DYSPLASIA, AND MALIGNANCY.   F. RECTUM; COLD BIOPSY:  - CHRONIC PROCTITIS WITH MILD ACTIVITY (CRYPTITIS).  - NEGATIVE FOR GRANULOMA, DYSPLASIA, AND MALIGNANCY.   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 Iron deficiency anemia 04/07/2017  . Lower abdominal pain 12/13/2016  . Postprandial diarrhea 12/13/2016  . Previous cesarean section 2017   non reassuring FHT  . Ulcerative colitis ([place])   . Weight loss 12/13/2016    Past Surgical History:  Procedure Laterality Date  . CESAREAN SECTION N/A 01/06/2016   Procedure: CESAREAN  SECTION;  Surgeon: Malachy Mood, MD;  Location: [facility] ORS;  Service: Obstetrics;  Laterality: N/A;  . COLONOSCOPY WITH PROPOFOL N/A 12/27/2016   Procedure: COLONOSCOPY WITH PROPOFOL;  Surgeon: Jonathon Bellows, MD;  Location: [facility] ENDOSCOPY;  Service: Gastroenterology;  Laterality: N/A;  . COLONOSCOPY WITH PROPOFOL N/A 03/27/2019   Procedure: COLONOSCOPY WITH PROPOFOL;  Surgeon: Lin Landsman, MD;  Location: [facility] ENDOSCOPY;  Service: Gastroenterology;  Laterality: N/A;  . ESOPHAGOGASTRODUODENOSCOPY (EGD) WITH PROPOFOL N/A 12/27/2016   Procedure: ESOPHAGOGASTRODUODENOSCOPY (EGD) WITH PROPOFOL;  Surgeon: Jonathon Bellows, MD;  Location: [facility] ENDOSCOPY;  Service: Gastroenterology;  Laterality: N/A;    Current Outpatient Medications:  .  inFLIXimab (REMICADE) 100 MG injection, Inject 100 mg into the vein every 8 (eight) weeks., Disp: , Rfl:  .  Na Sulfate-K Sulfate-Mg Sulf 17.5-3.13-1.6 GM/177ML SOLN, Take 354 mLs by mouth once for 1 dose., Disp: 354 mL, Rfl: 0 .  Vitamin D, Ergocalciferol, (DRISDOL) 1.25 MG (50000 UNIT) CAPS capsule, Take 1 capsule (50,000 Units total) by mouth every 7 (seven) days., Disp: 8 capsule, Rfl: 0    Family History  Problem Relation Age of Onset  . Diabetes Maternal Grandmother   . Liver cancer Maternal Grandfather   . Colitis Paternal Grandmother   . Stroke Neg Hx   . Breast cancer Neg Hx   . Ovarian cancer Neg Hx      Social History   Tobacco Use  . Smoking status: Former Smoker    Types: Cigarettes  Quit date: 11/24/2016    Years since quitting: 3.2  . Smokeless tobacco: Never Used  . Tobacco comment: has not smoked in 1-2 weeks  Vaping Use  . Vaping Use: Never used  Substance Use Topics  . Alcohol use: Yes    Comment: occasional glass of wine  . Drug use: Never    Allergies as of 03/09/2020  . (No Known Allergies)    Review of Systems:    All systems reviewed and negative except where noted in HPI.   Physical Exam:  BP 107/71 (BP Location:  Left Arm, Patient Position: Sitting, Cuff Size: Normal)   Pulse 73   Temp 98.1 F (36.7 C) (Oral)   Wt 149 lb 8 oz (67.8 kg)   BMI 25.66 kg/m  No LMP recorded.  General:   Alert, well-built, pleasant and cooperative in NAD Head:  Normocephalic and atraumatic. Eyes:  Sclera clear, no icterus.   Conjunctiva pink. Ears:  Normal auditory acuity. Nose:  No deformity, discharge, or lesions. Mouth:  No deformity or lesions,oropharynx pink & moist. Neck:  Supple; no masses or thyromegaly. Lungs:  Respirations even and unlabored.  Clear throughout to auscultation.   No wheezes, crackles, or rhonchi. No acute distress. Heart:  Regular rate and rhythm; no murmurs, clicks, rubs, or gallops. Abdomen:  Normal bowel sounds. Soft, nontender and non-distended without masses, hepatosplenomegaly or hernias noted.  No guarding or rebound tenderness.   Rectal: Nor performed Msk:  Symmetrical without gross deformities. Good, equal movement & strength bilaterally. Pulses:  Normal pulses noted. Extremities:  No clubbing or edema.  No cyanosis. Neurologic:  Alert and oriented x3;  grossly normal neurologically. Skin:  Intact without significant lesions or rashes. No jaundice. Psych:  Alert and cooperative. Normal mood and affect.  Imaging Studies: None  Assessment and Plan:   Chevella Pearce is a 40 y.o. African-American female is here follow-up ulcerative pancolitis in 12/2016.  She had significantly elevated fecal calprotectin 475, ESR 72. Her stool studies were negative for infection.  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 Anemia: History of severe iron deficiency anemia, status post parenteral iron therapy, anemia resolved.  Ferritin and B12 levels restored. 3.Immunizations: Hep A and B negative serologies, received Twinrix vaccine 3 doses, Influenza recommend but patient does not want flu vaccine, prevnar received in 10/2017, pneumovax received in 12/2018, varicella-had chickenpox as a child, recommend Shingrix vaccine, prescription given 4.Cancer screening I) Colon cancer/dysplasia surveillance:  Colonoscopy in 12/2016, no dysplasia, colonoscopy in 03/2018, no dysplasia Schedule colonoscopy II) Cervical cancer: Annual Pap smear negative to date III) Skin cancer - counseled about annual skin exam by dermatology and skin protection in summer using sun screen SPF > 50, clothing 5.Bone health Vitamin D status: Mild vitamin D deficiency, recommend vitamin D supplements daily Bone density testing:  Not done 5. Labs:  today 6. Smoking: quit smoking in 2021 7. NSAIDs and Antibiotics use: continue to avoid   Follow up in 6 months    [name], MD

## 2020-03-26 ENCOUNTER — Other Ambulatory Visit
Admission: RE | Admit: 2020-03-26 | Discharge: 2020-03-26 | Disposition: A | Discharge status: Home / Self Care | Payer: 59 | Source: Ambulatory Visit | Attending: Gastroenterology | Admitting: Gastroenterology

## 2020-03-26 ENCOUNTER — Other Ambulatory Visit: Payer: Self-pay

## 2020-03-26 DIAGNOSIS — Z01812 Encounter for preprocedural laboratory examination: Secondary | ICD-10-CM | POA: Diagnosis not present

## 2020-03-26 DIAGNOSIS — Z20822 Contact with and (suspected) exposure to covid-19: Secondary | ICD-10-CM | POA: Insufficient documentation

## 2020-03-26 LAB — SARS CORONAVIRUS 2 (TAT 6-24 HRS): SARS Coronavirus 2: NEGATIVE

## 2020-03-27 ENCOUNTER — Encounter: Payer: Self-pay | Admitting: Gastroenterology

## 2020-03-27 LAB — CALPROTECTIN, FECAL: Calprotectin, Fecal: 45 ug/g (ref 0–120)

## 2020-03-30 ENCOUNTER — Other Ambulatory Visit: Payer: Self-pay

## 2020-03-30 ENCOUNTER — Ambulatory Visit: Payer: 59 | Admitting: Certified Registered Nurse Anesthetist

## 2020-03-30 ENCOUNTER — Encounter: Payer: Self-pay | Admitting: Gastroenterology

## 2020-03-30 ENCOUNTER — Encounter
Admission: RE | Disposition: A | Discharge status: Home / Self Care | Payer: Self-pay | Source: Ambulatory Visit | Attending: Gastroenterology

## 2020-03-30 ENCOUNTER — Ambulatory Visit
Admission: RE | Admit: 2020-03-30 | Discharge: 2020-03-30 | Disposition: A | Discharge status: Home / Self Care | Payer: 59 | Source: Ambulatory Visit | Attending: Gastroenterology | Admitting: Gastroenterology

## 2020-03-30 DIAGNOSIS — K5100 Ulcerative (chronic) pancolitis without complications: Secondary | ICD-10-CM

## 2020-03-30 DIAGNOSIS — Z87891 Personal history of nicotine dependence: Secondary | ICD-10-CM | POA: Insufficient documentation

## 2020-03-30 DIAGNOSIS — K56609 Unspecified intestinal obstruction, unspecified as to partial versus complete obstruction: Secondary | ICD-10-CM | POA: Diagnosis not present

## 2020-03-30 DIAGNOSIS — K644 Residual hemorrhoidal skin tags: Secondary | ICD-10-CM | POA: Insufficient documentation

## 2020-03-30 DIAGNOSIS — Z833 Family history of diabetes mellitus: Secondary | ICD-10-CM | POA: Diagnosis not present

## 2020-03-30 HISTORY — PX: COLONOSCOPY WITH PROPOFOL: SHX5780

## 2020-03-30 LAB — POCT PREGNANCY, URINE: Preg Test, Ur: NEGATIVE

## 2020-03-30 SURGERY — COLONOSCOPY WITH PROPOFOL
Anesthesia: General

## 2020-03-30 MED ORDER — PROPOFOL 500 MG/50ML IV EMUL
INTRAVENOUS | Status: AC
  Filled 2020-03-30: qty 50

## 2020-03-30 MED ORDER — SODIUM CHLORIDE 0.9 % IV SOLN: INTRAVENOUS | Status: DC

## 2020-03-30 MED ORDER — LIDOCAINE HCL (CARDIAC) PF 100 MG/5ML IV SOSY
PREFILLED_SYRINGE | INTRAVENOUS | Status: DC | PRN
  Administered 2020-03-30: 50 mg via INTRAVENOUS

## 2020-03-30 MED ORDER — PROPOFOL 500 MG/50ML IV EMUL
INTRAVENOUS | Status: DC | PRN
  Administered 2020-03-30: 150 ug/kg/min via INTRAVENOUS

## 2020-03-30 MED ORDER — PROPOFOL 10 MG/ML IV BOLUS
INTRAVENOUS | Status: DC | PRN
  Administered 2020-03-30: 70 mg via INTRAVENOUS

## 2020-03-30 NOTE — Anesthesia Preprocedure Evaluation (Addendum)
Anesthesia Evaluation  Patient identified by MRN, date of birth, ID band Patient awake    Reviewed: Allergy & Precautions, H&P , NPO status , Patient's Chart, lab work & pertinent test results  History of Anesthesia Complications Negative for: history of anesthetic complications  Airway Mallampati: II  TM Distance: >3 FB Neck ROM: full    Dental  (+) Teeth Intact   Pulmonary neg sleep apnea, neg COPD, former smoker,    breath sounds clear to auscultation       Cardiovascular (-) angina(-) Past MI and (-) Cardiac Stents negative cardio ROS  (-) dysrhythmias  Rhythm:regular Rate:Normal     Neuro/Psych negative neurological ROS  negative psych ROS   GI/Hepatic Neg liver ROS, PUD, UC   Endo/Other  negative endocrine ROS  Renal/GU negative Renal ROS  negative genitourinary   Musculoskeletal   Abdominal   Peds  Hematology negative hematology ROS (+)   Anesthesia Other Findings Past Medical History: 04/07/2017: Iron deficiency anemia 12/13/2016: Lower abdominal pain 12/13/2016: Postprandial diarrhea 2017: Previous cesarean section     Comment:  non reassuring FHT No date: Ulcerative colitis ([place]) 12/13/2016: Weight loss  Past Surgical History: 01/06/2016: CESAREAN SECTION; N/A     Comment:  Procedure: CESAREAN SECTION;  Surgeon: Malachy Mood,              MD;  Location: [facility] ORS;  Service: Obstetrics;                Laterality: N/A; 12/27/2016: COLONOSCOPY WITH PROPOFOL; N/A     Comment:  Procedure: COLONOSCOPY WITH PROPOFOL;  Surgeon: Jonathon Bellows, MD;  Location: [facility] ENDOSCOPY;  Service:               Gastroenterology;  Laterality: N/A; 03/27/2019: COLONOSCOPY WITH PROPOFOL; N/A     Comment:  Procedure: COLONOSCOPY WITH PROPOFOL;  Surgeon: Lin Landsman, MD;  Location: [facility] ENDOSCOPY;  Service:               Gastroenterology;  Laterality: N/A; 12/27/2016:  ESOPHAGOGASTRODUODENOSCOPY (EGD) WITH PROPOFOL; N/A     Comment:  Procedure: ESOPHAGOGASTRODUODENOSCOPY (EGD) WITH               PROPOFOL;  Surgeon: Jonathon Bellows, MD;  Location: [facility]               ENDOSCOPY;  Service: Gastroenterology;  Laterality: N/A;     Reproductive/Obstetrics negative OB ROS                            Anesthesia Physical Anesthesia Plan  ASA: I  Anesthesia Plan: General   Post-op Pain Management:    Induction:   PONV Risk Score and Plan: Propofol infusion and TIVA  Airway Management Planned:   Additional Equipment:   Intra-op Plan:   Post-operative Plan:   Informed Consent: I have reviewed the patients History and Physical, chart, labs and discussed the procedure including the risks, benefits and alternatives for the proposed anesthesia with the patient or authorized representative who has indicated his/her understanding and acceptance.     Dental Advisory Given  Plan Discussed with: Anesthesiologist, CRNA and Surgeon  Anesthesia Plan Comments:        Anesthesia Quick Evaluation

## 2020-03-30 NOTE — Op Note (Signed)
[facility] Gastroenterology Patient Name: Christina Obrien Procedure Date: 03/30/2020 10:36 AM MRN: [id number] Account #: [id number] Date of Birth: 08-03-1980 Admit Type: Outpatient Age: 40 Room: [facility] ENDO ROOM 1 Gender: Female Note Status: Finalized Procedure:             Colonoscopy Indications:           Last colonoscopy: March 2021, Disease activity                         assessment of chronic ulcerative pancolitis, Assess                         therapeutic response to therapy of chronic ulcerative                         pancolitis Providers:             Lin Landsman MD, MD Medicines:             General Anesthesia Complications:         No immediate complications. Estimated blood loss:                         Minimal. Procedure:             Pre-Anesthesia Assessment:                        - Prior to the procedure, a History and Physical was                         performed, and patient medications and allergies were                         reviewed. The patient is competent. The risks and                         benefits of the procedure and the sedation options and                         risks were discussed with the patient. All questions                         were answered and informed consent was obtained.                         Patient identification and proposed procedure were                         verified by the physician, the nurse, the                         anesthesiologist, the anesthetist and the technician                         in the pre-procedure area in the procedure room in the                         endoscopy suite. Mental Status Examination: alert and  oriented. Airway Examination: normal oropharyngeal                         airway and neck mobility. Respiratory Examination:                         clear to auscultation. CV Examination: normal.                         Prophylactic Antibiotics: The  patient does not require                         prophylactic antibiotics. Prior Anticoagulants: The                         patient has taken no previous anticoagulant or                         antiplatelet agents. ASA Grade Assessment: I - A                         normal, healthy patient. After reviewing the risks and                         benefits, the patient was deemed in satisfactory                         condition to undergo the procedure. The anesthesia                         plan was to use general anesthesia. Immediately prior                         to administration of medications, the patient was                         re-assessed for adequacy to receive sedatives. The                         heart rate, respiratory rate, oxygen saturations,                         blood pressure, adequacy of pulmonary ventilation, and                         response to care were monitored throughout the                         procedure. The physical status of the patient was                         re-assessed after the procedure.                        After obtaining informed consent, the colonoscope was                         passed under direct vision. Throughout the procedure,  the patient's blood pressure, pulse, and oxygen                         saturations were monitored continuously. The                         Colonoscope was introduced through the anus and                         advanced to the the terminal ileum, with                         identification of the appendiceal orifice and IC                         valve. The colonoscopy was extremely difficult due to                         bowel stenosis. Successful completion of the procedure                         was aided by withdrawing the scope and replacing with                         the pediatric colonoscope. The patient tolerated the                         procedure well. The quality  of the bowel preparation                         was fair. Findings:      Skin tags were found on perianal exam.      The terminal ileum appeared normal.      Inflammation was not found based on the endoscopic appearance of the       mucosa in the colon. This was graded as Mayo Score 0 (normal or inactive       disease), and when compared to the previous examination, the findings       are improved. Biopsies were taken with a cold forceps for histology.      A benign-appearing, intrinsic mild stenosis was found in the       recto-sigmoid colon secondary to underlying colitis and was traversed       with peds colonoscope.      The retroflexed view of the distal rectum and anal verge was normal and       showed no anal or rectal abnormalities. Impression:            - Preparation of the colon was fair.                        - Perianal skin tags found on perianal exam.                        - The examined portion of the ileum was normal.                        - Inactive (Mayo Score 0) ulcerative colitis, in  remission, improved since the last examination.                         Biopsied.                        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                        - The distal rectum and anal verge are normal on                         retroflexion view. Recommendation:        - Discharge patient to home (with escort).                        - Resume previous diet today.                        - Continue present medications.                        - Await pathology results.                        - Return to my office as previously scheduled. Procedure Code(s):     --- Professional ---                        [phone], Colonoscopy, flexible; with biopsy, single or                         multiple Diagnosis Code(s):     --- Professional ---                        K51.012, Ulcerative (chronic) pancolitis with                         intestinal obstruction                         K64.4, Residual hemorrhoidal skin tags CPT copyright 2019 American Medical Association. All rights reserved. The codes documented in this report are preliminary and upon coder review may  be revised to meet current compliance requirements. Dr. Ulyess Mort Lin Landsman MD, MD 03/30/2020 11:15:45 AM This report has been signed electronically. Number of Addenda: 0 Note Initiated On: 03/30/2020 10:36 AM Scope Withdrawal Time: 0 hours 11 minutes 18 seconds  Total Procedure Duration: 0 hours 21 minutes 1 second  Estimated Blood Loss:  Estimated blood loss was minimal.      [facility]

## 2020-03-30 NOTE — Transfer of Care (Signed)
Immediate Anesthesia Transfer of Care Note  Patient: Christina Obrien  Procedure(s) Performed: COLONOSCOPY WITH PROPOFOL (N/A )  Patient Location: Endoscopy Unit  Anesthesia Type:General  Level of Consciousness: drowsy  Airway & Oxygen Therapy: Patient Spontanous Breathing  Post-op Assessment: Report given to RN and Post -op Vital signs reviewed and stable  Post vital signs: Reviewed and stable  Last Vitals:  Vitals Value Taken Time  BP 106/67 03/30/20 1115  Temp    Pulse 84 03/30/20 1115  Resp 19 03/30/20 1115  SpO2 99 % 03/30/20 1115    Last Pain:  Vitals:   03/30/20 1115  TempSrc:   PainSc: 0-No pain         Complications: No complications documented.

## 2020-03-30 NOTE — H&P (Signed)
Christina Darby, MD [address]  [place]  [place], [place] [address]  Main: [phone]  Fax: [phone] Pager: [phone]  Primary Care Physician:  Dalia Heading, CNM (Inactive) Primary Gastroenterologist:  Dr. Cephas Obrien  Pre-Procedure History & Physical: HPI:  Christina Obrien is a 40 y.o. female is here for an colonoscopy.   Past Medical History:  Diagnosis Date  . Iron deficiency anemia 04/07/2017  . Lower abdominal pain 12/13/2016  . Postprandial diarrhea 12/13/2016  . Previous cesarean section 2017   non reassuring FHT  . Ulcerative colitis ([place])   . Weight loss 12/13/2016    Past Surgical History:  Procedure Laterality Date  . CESAREAN SECTION N/A 01/06/2016   Procedure: CESAREAN SECTION;  Surgeon: Malachy Mood, MD;  Location: [facility] ORS;  Service: Obstetrics;  Laterality: N/A;  . COLONOSCOPY WITH PROPOFOL N/A 12/27/2016   Procedure: COLONOSCOPY WITH PROPOFOL;  Surgeon: Jonathon Bellows, MD;  Location: [facility] ENDOSCOPY;  Service: Gastroenterology;  Laterality: N/A;  . COLONOSCOPY WITH PROPOFOL N/A 03/27/2019   Procedure: COLONOSCOPY WITH PROPOFOL;  Surgeon: Lin Landsman, MD;  Location: [facility] ENDOSCOPY;  Service: Gastroenterology;  Laterality: N/A;  . ESOPHAGOGASTRODUODENOSCOPY (EGD) WITH PROPOFOL N/A 12/27/2016   Procedure: ESOPHAGOGASTRODUODENOSCOPY (EGD) WITH PROPOFOL;  Surgeon: Jonathon Bellows, MD;  Location: [facility] ENDOSCOPY;  Service: Gastroenterology;  Laterality: N/A;    Prior to Admission medications   Medication Sig Start Date End Date Taking? Authorizing Provider  inFLIXimab (REMICADE) 100 MG injection Inject 100 mg into the vein every 8 (eight) weeks.    [provider]  Vitamin D, Ergocalciferol, (DRISDOL) 1.25 MG (50000 UNIT) CAPS capsule Take 1 capsule (50,000 Units total) by mouth every 7 (seven) days. 11/12/19   Lin Landsman, MD    Allergies as of 03/09/2020  . (No Known Allergies)    Family History  Problem  Relation Age of Onset  . Diabetes Maternal Grandmother   . Liver cancer Maternal Grandfather   . Colitis Paternal Grandmother   . Stroke Neg Hx   . Breast cancer Neg Hx   . Ovarian cancer Neg Hx     Social History   Socioeconomic History  . Marital status: Single    Spouse name: Not on file  . Number of children: 2  . Years of education: Not on file  . Highest education level: Not on file  Occupational History  . Not on file  Tobacco Use  . Smoking status: Former Smoker    Types: Cigarettes    Quit date: 11/24/2016    Years since quitting: 3.3  . Smokeless tobacco: Never Used  . Tobacco comment: has not smoked in 1-2 weeks  Vaping Use  . Vaping Use: Never used  Substance and Sexual Activity  . Alcohol use: Yes    Comment: occasional glass of wine  . Drug use: Never  . Sexual activity: Yes    Partners: Male    Birth control/protection: None, Condom    Comment: occasional condoms  Other Topics Concern  . Not on file  Social History Narrative  . Not on file   Social Determinants of Health   Financial Resource Strain: Not on file  Food Insecurity: Not on file  Transportation Needs: Not on file  Physical Activity: Not on file  Stress: Not on file  Social Connections: Not on file  Intimate Partner Violence: Not on file    Review of Systems: See HPI, otherwise negative ROS  Physical Exam: BP 106/78   Pulse 75  Temp (!) 97.5 F (36.4 C) (Temporal)   Resp 20   Ht 5' 4"  (1.626 m)   Wt 67.6 kg   LMP 03/25/2020   SpO2 99%   BMI 25.58 kg/m  General:   Alert,  pleasant and cooperative in NAD Head:  Normocephalic and atraumatic. Neck:  Supple; no masses or thyromegaly. Lungs:  Clear throughout to auscultation.    Heart:  Regular rate and rhythm. Abdomen:  Soft, nontender and nondistended. Normal bowel sounds, without guarding, and without rebound.   Neurologic:  Alert and  oriented x4;  grossly normal neurologically.  Impression/Plan: [name]  is here for an colonoscopy to be performed for h/o UC  Risks, benefits, limitations, and alternatives regarding  colonoscopy have been reviewed with the patient.  Questions have been answered.  All parties agreeable.   Sherri Sear, MD  03/30/2020, 10:30 AM

## 2020-03-31 ENCOUNTER — Telehealth: Payer: Self-pay

## 2020-03-31 LAB — SURGICAL PATHOLOGY

## 2020-03-31 NOTE — Telephone Encounter (Signed)
-----   Message from Lin Landsman, MD sent at 03/31/2020  3:17 PM EST ----- Pathology results from recent colonoscopy shows significant improvement in the inflammation of her colon.  We will continue the current infusion  RV

## 2020-03-31 NOTE — Anesthesia Postprocedure Evaluation (Signed)
Anesthesia Post Note  Patient: Christina Obrien  Procedure(s) Performed: COLONOSCOPY WITH PROPOFOL (N/A )  Patient location during evaluation: PACU Anesthesia Type: General Level of consciousness: awake and alert Pain management: pain level controlled Vital Signs Assessment: post-procedure vital signs reviewed and stable Respiratory status: spontaneous breathing, nonlabored ventilation and respiratory function stable Cardiovascular status: blood pressure returned to baseline and stable Postop Assessment: no apparent nausea or vomiting Anesthetic complications: no   No complications documented.   Last Vitals:  Vitals:   03/30/20 1125 03/30/20 1135  BP: 121/73 123/83  Pulse: 83 (!) 56  Resp: 14 12  Temp:    SpO2: 100% 100%    Last Pain:  Vitals:   03/31/20 0730  TempSrc:   PainSc: 0-No pain                 Brett Canales Dyson Sevey

## 2020-03-31 NOTE — Telephone Encounter (Signed)
Patient verbalized understanding of instructions  

## 2020-04-27 ENCOUNTER — Encounter: Payer: Self-pay | Admitting: Gastroenterology

## 2020-11-11 ENCOUNTER — Encounter: Payer: Self-pay | Admitting: Gastroenterology

## 2020-11-11 ENCOUNTER — Ambulatory Visit: Payer: 59 | Admitting: Gastroenterology

## 2020-11-11 ENCOUNTER — Other Ambulatory Visit: Payer: Self-pay

## 2020-11-11 VITALS — BP 104/70 | HR 77 | Temp 98.3°F | Ht 64.0 in | Wt 148.6 lb

## 2020-11-11 DIAGNOSIS — K5100 Ulcerative (chronic) pancolitis without complications: Secondary | ICD-10-CM

## 2020-11-11 MED ORDER — SHINGRIX 50 MCG/0.5ML IM SUSR: 0.5000 mL | Freq: Once | INTRAMUSCULAR | 1 refills | Status: AC

## 2020-11-11 NOTE — Progress Notes (Signed)
[name], MD [address]  [place]  [place], [place] [address]  Main: [phone]  Fax: [phone]    Gastroenterology Consultation  Referring Provider:     No ref. provider found Primary Care Physician:  Dalia Heading, CNM (Inactive) Primary Gastroenterologist:  Dr. [name] Reason for Consultation:   Panulcerative colitis        HPI:   Christina Obrien is a 40 y.o. female referred by [facility]  for consultation & management of panulcerative colitis.  Patient is diagnosed with ulcerative colitis in 12/2016, previously maintained on mesalamine, later switched to Remicade due to progression of disease.  Patient is currently maintained on Inflectra 5 mg/kg body weight every 8 weeks.  Patient received 3 infusions of Inflectra since October 2021, last dose today.  Patient reports that she has been tolerating it well.  She does report intermittent constipation.  She has been gaining weight.  She denies abdominal pain, nausea, rectal bleeding.  She does not have any GI concerns today  Follow-up visit 11/11/2020 Patient is here for follow-up of UC.  She is currently on Inflectra, she is overdue by 2 weeks to receive her infusion.  She was told by her insurance that she has to be seen by me before she schedules her infusion.  She notices mild abdominal discomfort, 2 soft stools in the morning and feeling nauseous associated with vomiting.She thinks she has having breakthrough symptoms.  Otherwise, she has been doing well.  She underwent colonoscopy in 3/22 when she was on Inflectra, found to have significant improvement in her inflammation.  Her pathology results confirmed minimal activity, presence of single minute nonnecrotizing granuloma in the rectal biopsies.  Antiplts/Anticoagulants/Anti thrombotics: none  NSAIDs: she used to take ibuprofen about once a week or less for occasional headaches She denies taking any over-the-counter supplements other than  multivitamin   GI Procedures:  EGD and colonoscopy on 12/27/2016 by Dr. Vicente Males The esophagus was normal. Patchy moderate inflammation characterized by adherent blood, congestion (edema), erosions and erythema was found in the gastric antrum. Biopsies were taken with a cold forceps for histology. Two non-bleeding superficial gastric ulcers with no stigmata of bleeding were found in the stomach. The largest lesion was 4 mm in largest dimension. Biopsies were taken with a cold forceps for histology. Duodenum was inflamed  Colonoscopy - Preparation of the colon was poor. - Patchy moderate inflammation was found in the entire examined colon secondary to colitis. Biopsied. - One 6 mm polyp in the ascending colon, removed with a cold biopsy forceps. Resected and retrieved. - The examination was otherwise normal on direct and retroflexion views. DIAGNOSIS:  A. 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  - NEGATIVE FOR ACTIVE INFLAMMATION, INTRAEPITHELIAL LYMPHOCYTOSIS, AND  INFECTIOUS AGENTS.   B. DUODENUM, THIRD PORTION; COLD BIOPSY:  - DUODENAL MUCOSA WITH INTACT VILLI.  - NEGATIVE FOR ACTIVE INFLAMMATION, INTRAEPITHELIAL LYMPHOCYTOSIS, AND  INFECTIOUS AGENTS.   C. STOMACH ADJACENT TO ULCER; COLD BIOPSY:  - ANTRAL-TYPE MUCOSA WITH MILD CHRONIC GASTRITIS, SEE COMMENT.  - REACTIVE FOVEOLAR HYPERPLASIA.  - NEGATIVE FOR HELICOBACTER PYLORI BY IMMUNOHISTOCHEMISTRY (IHC).   Comment:  There are a few foci of gastric glandular injury surrounded by  lymphocytes, eosinophils, and macrophages. Neutrophils are not  identified. This focally enhanced gastritis pattern is not specific in  adults but can be a medication effect, and can be seen in inflammatory  bowel disease. No granulomas or viral inclusions  are seen. One tiny  crushed mucosal fragment shows a few goblet cells but I cannot be sure  of the type of tissue. Control for H pylori IHC stained  appropriately.   D. RIGHT COLON; RANDOM COLD BIOPSY:  - MILD ACTIVE COLITIS WITH FEATURES OF CHRONICITY.   E. COLON POLYP, ASCENDING; COLD BIOPSY:  - CHRONIC ULCER IN A BACKGROUND OF ACTIVE COLITIS, SEE COMMENT.   F. TRANSVERSE COLON; RANDOM COLD BIOPSY:  - MILD ACTIVE COLITIS WITH FEATURES OF CHRONICITY.   G. SIGMOID COLON; RANDOM COLD BIOPSY:  - MILD ACTIVE COLITIS WITH FEATURES OF CHRONICITY.   H. RECTUM; RANDOM COLD BIOPSY:  - MILD ACTIVE PROCTITIS WITH FEATURES OF CHRONICITY.   Comment:  All of the samples are negative for dysplasia and malignancy.   Colonoscopy 03/27/19 - Preparation of the colon was fair. - Perianal skin tags found on perianal exam. - The examined portion of the ileum was normal. Biopsied. 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 Biopsied. - Severe (Mayo Score 3) left-sided ulcerative colitis, worsened since the last examination. Biopsied. - Inactive (Mayo Score 0) ulcerative colitis, improved since the last examination. Biopsied. - Non-bleeding external hemorrhoids. DIAGNOSIS:  A. TERMINAL ILEUM; COLD BIOPSY:  - ENTERIC MUCOSA WITH NORMAL VILLOUS ARCHITECTURE AND NO SIGNIFICANT  HISTOPATHOLOGIC CHANGE.  - NEGATIVE FOR ACTIVE MUCOSAL ENTERITIS.  - NEGATIVE FOR GRANULOMA, DYSPLASIA, AND MALIGNANCY.   B. COLON, RIGHT; COLD BIOPSY:  - CHRONIC COLITIS WITHOUT MUCOSAL ACTIVITY.  - NEGATIVE FOR GRANULOMA, DYSPLASIA, AND MALIGNANCY.   C. COLON, TRANSVERSE; COLD BIOPSY:  - CHRONIC COLITIS WITHOUT MUCOSAL ACTIVITY.  - NEGATIVE FOR GRANULOMA, DYSPLASIA, AND MALIGNANCY.   D. COLON, LEFT; COLD BIOPSY:  - CHRONIC COLITIS WITHOUT MUCOSAL ACTIVITY.  - NEGATIVE FOR GRANULOMA, DYSPLASIA, AND MALIGNANCY.   E. COLON, RECTOSIGMOID STRICTURE; COLD BIOPSY:  - CHRONIC PROCTO-COLITIS WITH MODERATE ACTIVITY (CRYPTITIS AND FOCAL  CRYPT ABSCESS; NO DEFINITE ULCERATION OR CRYPT DROPOUT).  - NEGATIVE FOR GRANULOMA, DYSPLASIA, AND MALIGNANCY.   F. RECTUM; COLD BIOPSY:  -  CHRONIC PROCTITIS WITH MILD ACTIVITY (CRYPTITIS).  - NEGATIVE FOR GRANULOMA, DYSPLASIA, AND MALIGNANCY.   Colonoscopy 03/30/2020 - Preparation of the colon was fair. - Perianal skin tags found on perianal exam. - The examined portion of the ileum was normal. - Inactive (Mayo Score 0) ulcerative colitis, in remission, improved since the last examination. Biopsied. 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 - The distal rectum and anal verge are normal on retroflexion view. DIAGNOSIS:  A. COLON, RANDOM RIGHT; COLD BIOPSY:  - BENIGN COLONIC MUCOSA WITH MINIMAL ARCHITECTURAL CHANGES OF CHRONIC  COLITIS, WITHOUT MUCOSAL ACTIVITY.  - NEGATIVE FOR GRANULOMA, DYSPLASIA, AND MALIGNANCY.   B. COLON, RANDOM LEFT; COLD BIOPSY:  - BENIGN COLONIC MUCOSA WITH MINIMAL ARCHITECTURAL CHANGES OF CHRONIC  COLITIS, WITHOUT MUCOSAL ACTIVITY.  - NEGATIVE FOR GRANULOMA, DYSPLASIA, AND MALIGNANCY.   C. RECTUM; COLD BIOPSY:  - BENIGN RECTAL MUCOSA WITH MINIMAL ARCHITECTURAL CHANGES OF CHRONIC  PROCTITIS, WITHOUT MUCOSAL ACTIVITY.  - SINGLE MINUTE NON-NECROTIZING GRANULOMA.  - NEGATIVE FOR DYSPLASIA AND MALIGNANCY.  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[employer] 2 kids, single   Past Medical History:  Diagnosis Date   Iron deficiency anemia 04/07/2017   Lower abdominal pain 12/13/2016   Postprandial diarrhea 12/13/2016   Previous cesarean section 2017   non reassuring FHT   Ulcerative colitis ([place])    Weight loss 12/13/2016  Past Surgical History:  Procedure Laterality Date   CESAREAN SECTION N/A 01/06/2016   Procedure: CESAREAN SECTION;  Surgeon: Malachy Mood, MD;  Location: [facility] ORS;  Service: Obstetrics;  Laterality: N/A;   COLONOSCOPY WITH PROPOFOL N/A 12/27/2016   Procedure: COLONOSCOPY WITH PROPOFOL;  Surgeon: Jonathon Bellows, MD;  Location: [facility]  ENDOSCOPY;  Service: Gastroenterology;  Laterality: N/A;   COLONOSCOPY WITH PROPOFOL N/A 03/27/2019   Procedure: COLONOSCOPY WITH PROPOFOL;  Surgeon: Lin Landsman, MD;  Location: [facility] ENDOSCOPY;  Service: Gastroenterology;  Laterality: N/A;   COLONOSCOPY WITH PROPOFOL N/A 03/30/2020   Procedure: COLONOSCOPY WITH PROPOFOL;  Surgeon: Lin Landsman, MD;  Location: [facility] ENDOSCOPY;  Service: Gastroenterology;  Laterality: N/A;   ESOPHAGOGASTRODUODENOSCOPY (EGD) WITH PROPOFOL N/A 12/27/2016   Procedure: ESOPHAGOGASTRODUODENOSCOPY (EGD) WITH PROPOFOL;  Surgeon: Jonathon Bellows, MD;  Location: [facility] ENDOSCOPY;  Service: Gastroenterology;  Laterality: N/A;    Current Outpatient Medications:    inFLIXimab-dyyb (INFLECTRA) 100 MG SOLR, Inject 100 mg into the vein every 8 (eight) weeks., Disp: , Rfl:    Vitamin D, Ergocalciferol, (DRISDOL) 1.25 MG (50000 UNIT) CAPS capsule, Take 1 capsule (50,000 Units total) by mouth every 7 (seven) days., Disp: 8 capsule, Rfl: 0   Zoster Vaccine Adjuvanted (SHINGRIX) injection, Inject 0.5 mLs into the muscle once for 1 dose., Disp: 1 each, Rfl: 1    Family History  Problem Relation Age of Onset   Diabetes Maternal Grandmother    Liver cancer Maternal Grandfather    Colitis Paternal Grandmother    Stroke Neg Hx    Breast cancer Neg Hx    Ovarian cancer Neg Hx      Social History   Tobacco Use   Smoking status: Former    Types: Cigarettes    Quit date: 11/24/2016    Years since quitting: 3.9   Smokeless tobacco: Never   Tobacco comments:    has not smoked in 1-2 weeks  Vaping Use   Vaping Use: Never used  Substance Use Topics   Alcohol use: Yes    Comment: occasional glass of wine   Drug use: Never    Allergies as of 11/11/2020   (No Known Allergies)    Review of Systems:    All systems reviewed and negative except where noted in HPI.   Physical Exam:  BP 104/70   Pulse 77   Temp 98.3 F (36.8 C) (Oral)   Ht _0  (1.626 m)   Wt 148 lb  9.6 oz (67.4 kg)   BMI 25.51 kg/m  No LMP recorded.  General:   Alert, well-built, pleasant and cooperative in NAD Head:  Normocephalic and atraumatic. Eyes:  Sclera clear, no icterus.   Conjunctiva pink. Ears:  Normal auditory acuity. Nose:  No deformity, discharge, or lesions. Mouth:  No deformity or lesions,oropharynx pink & moist. Neck:  Supple; no masses or thyromegaly. Lungs:  Respirations even and unlabored.  Clear throughout to auscultation.   No wheezes, crackles, or rhonchi. No acute distress. Heart:  Regular rate and rhythm; no murmurs, clicks, rubs, or gallops. Abdomen:  Normal bowel sounds. Soft, nontender and non-distended without masses, hepatosplenomegaly or hernias noted.  No guarding or rebound tenderness.   Rectal: Nor performed Msk:  Symmetrical without gross deformities. Good, equal movement & strength bilaterally. Pulses:  Normal pulses noted. Extremities:  No clubbing or edema.  No cyanosis. Neurologic:  Alert and oriented x3;  grossly normal neurologically. Skin:  Intact without significant lesions or rashes. No jaundice. Psych:  Alert and  cooperative. Normal mood and affect.  Imaging Studies: None  Assessment and Plan:   Coy Vandoren is a 40 y.o. African-American female is here follow-up ulcerative pancolitis in 12/2016.  She had significantly elevated fecal calprotectin 475, ESR 72. Her stool studies were negative for infection.  Previously maintained on infliximab monotherapy, in clinical remission, had to be switched to Inflectra due to change in insurance since 10/2019  PanUlcerative colitis: left > right colon Continue Inflectra every 8 weeks Infliximab trough levels are therapeutic fecal calprotectin levels are normal, these were elevated to 483 about  1 year ago Patient has been getting labs with every infusion, CBC, CMP are unremarkable  IBD Health Maintenance  1.TB status: gold quantiferon negative, as of 01/31/2019 2. Anemia: History of  severe iron deficiency anemia, status post parenteral iron therapy, anemia resolved.  Ferritin and B12 levels restored. 3.Immunizations: Hep A and B negative serologies, received Twinrix vaccine 3 doses, Influenza recommend but patient does not want flu vaccine, prevnar received in 10/2017, pneumovax received in 12/2018, varicella-had chickenpox as a child, recommend Shingrix vaccine, prescription given 4.Cancer screening I) Colon cancer/dysplasia surveillance:  Colonoscopy in 12/2016, no dysplasia, colonoscopy in 03/2018, no dysplasia Schedule colonoscopy II) Cervical cancer: Annual Pap smear negative to date III) Skin cancer - counseled about annual skin exam by dermatology and skin protection in summer using sun screen SPF > 50, clothing 5.Bone health Vitamin D status: Mild vitamin D deficiency, recommend vitamin D supplements daily Bone density testing:  Not done 5. Labs:  today 6. Smoking: quit smoking in 2021 7. NSAIDs and Antibiotics use: continue to avoid   Follow up in 6 months    [name], MD

## 2020-11-17 LAB — COMPREHENSIVE METABOLIC PANEL
ALT: 8 IU/L (ref 0–32)
AST: 16 IU/L (ref 0–40)
Albumin/Globulin Ratio: 1.6 (ref 1.2–2.2)
Albumin: 4.2 g/dL (ref 3.8–4.8)
Alkaline Phosphatase: 55 IU/L (ref 44–121)
BUN/Creatinine Ratio: 14 (ref 9–23)
BUN: 11 mg/dL (ref 6–24)
Bilirubin Total: <0.2 mg/dL (ref 0.0–1.2)
CO2: 23 mmol/L (ref 20–29)
Calcium: 8.7 mg/dL (ref 8.7–10.2)
Chloride: 103 mmol/L (ref 96–106)
Creatinine, Ser: 0.81 mg/dL (ref 0.57–1.00)
Globulin, Total: 2.7 g/dL (ref 1.5–4.5)
Glucose: 80 mg/dL (ref 70–99)
Potassium: 3.8 mmol/L (ref 3.5–5.2)
Sodium: 139 mmol/L (ref 134–144)
Total Protein: 6.9 g/dL (ref 6.0–8.5)
eGFR: 93 mL/min/{1.73_m2} (ref >59)

## 2020-11-17 LAB — CBC
Hematocrit: 32.9 % — ABNORMAL LOW (ref 34.0–46.6)
Hemoglobin: 11.2 g/dL (ref 11.1–15.9)
MCH: 28.9 pg (ref 26.6–33.0)
MCHC: 34 g/dL (ref 31.5–35.7)
MCV: 85 fL (ref 79–97)
Platelets: 185 10*3/uL (ref 150–450)
RBC: 3.88 x10E6/uL (ref 3.77–5.28)
RDW: 12.2 % (ref 11.7–15.4)
WBC: 10.6 10*3/uL (ref 3.4–10.8)

## 2020-11-18 ENCOUNTER — Telehealth: Payer: Self-pay

## 2020-11-18 NOTE — Telephone Encounter (Signed)
Spoke with patient optum was waiting for her finished doctor note I printed and faxed 11/18/2020

## 2021-05-31 ENCOUNTER — Encounter: Payer: Self-pay | Admitting: Gastroenterology

## 2021-07-19 ENCOUNTER — Telehealth: Payer: Self-pay

## 2021-07-19 NOTE — Telephone Encounter (Signed)
We received nursing plan of care and plan of treatment for patient. Patient is due for a follow up appointment with our office. Called patient and left a message for call back

## 2021-08-18 ENCOUNTER — Encounter: Payer: Self-pay | Admitting: Oncology

## 2021-08-18 ENCOUNTER — Ambulatory Visit: Payer: 59 | Admitting: Gastroenterology

## 2021-08-19 ENCOUNTER — Encounter: Payer: Self-pay | Admitting: Oncology

## 2021-08-23 ENCOUNTER — Ambulatory Visit: Payer: 59 | Admitting: Gastroenterology

## 2021-10-14 ENCOUNTER — Encounter: Payer: Self-pay | Admitting: Oncology

## 2021-10-18 ENCOUNTER — Encounter: Payer: Self-pay | Admitting: Oncology

## 2021-11-03 ENCOUNTER — Encounter: Payer: Self-pay | Admitting: Gastroenterology

## 2021-11-03 ENCOUNTER — Ambulatory Visit (INDEPENDENT_AMBULATORY_CARE_PROVIDER_SITE_OTHER): Payer: BC Managed Care – PPO | Admitting: Gastroenterology

## 2021-11-03 ENCOUNTER — Encounter: Payer: Self-pay | Admitting: Oncology

## 2021-11-03 VITALS — BP 132/83 | HR 92 | Temp 98.6°F | Ht 64.0 in | Wt 147.4 lb

## 2021-11-03 DIAGNOSIS — K5100 Ulcerative (chronic) pancolitis without complications: Secondary | ICD-10-CM | POA: Diagnosis not present

## 2021-11-03 MED ORDER — SHINGRIX 50 MCG/0.5ML IM SUSR: 0.5000 mL | Freq: Once | INTRAMUSCULAR | 0 refills | Status: AC

## 2021-11-03 NOTE — Progress Notes (Signed)
[name], MD [address]  [place], [place] [address]  Main: [phone]  Fax: [phone]    Gastroenterology Consultation  Referring Provider:     No ref. provider found Primary Care Physician:  Dalia Heading, CNM (Inactive) Primary Gastroenterologist:  Dr. [name] Reason for Consultation:   Panulcerative colitis        HPI:   Christina Obrien is a 41 y.o. female referred by [facility]  for consultation & management of panulcerative colitis.  Patient is diagnosed with ulcerative colitis in 12/2016, previously maintained on mesalamine, later switched to Remicade due to progression of disease.  Patient is currently maintained on Inflectra 5 mg/kg body weight every 8 weeks. Patient reports that she has been tolerating it well. She has been gaining weight.  She denies abdominal pain, nausea, rectal bleeding.  She does not have any GI concerns today She underwent colonoscopy in 3/22 when she was on Inflectra, found to have significant improvement in her inflammation.  Her pathology results confirmed minimal activity, presence of single minute nonnecrotizing granuloma in the rectal biopsies.   Antiplts/Anticoagulants/Anti thrombotics: none  NSAIDs: she used to take ibuprofen about once a week or less for occasional headaches She denies taking any over-the-counter supplements other than multivitamin   GI Procedures:  EGD and colonoscopy on 12/27/2016 by Dr. Vicente Males The esophagus was normal. Patchy moderate inflammation characterized by adherent blood, congestion (edema), erosions and erythema was found in the gastric antrum. Biopsies were taken with a cold forceps for histology. Two non-bleeding superficial gastric ulcers with no stigmata of bleeding were found in the stomach. The largest lesion was 4 mm in largest dimension. Biopsies were taken with a cold forceps for histology. Duodenum was inflamed  Colonoscopy - Preparation of the colon was  poor. - Patchy moderate inflammation was found in the entire examined colon secondary to colitis. Biopsied. - One 6 mm polyp in the ascending colon, removed with a cold biopsy forceps. Resected and retrieved. - The examination was otherwise normal on direct and retroflexion views. DIAGNOSIS:  A. 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  - NEGATIVE FOR ACTIVE INFLAMMATION, INTRAEPITHELIAL LYMPHOCYTOSIS, AND  INFECTIOUS AGENTS.   B. DUODENUM, THIRD PORTION; COLD BIOPSY:  - DUODENAL MUCOSA WITH INTACT VILLI.  - NEGATIVE FOR ACTIVE INFLAMMATION, INTRAEPITHELIAL LYMPHOCYTOSIS, AND  INFECTIOUS AGENTS.   C. STOMACH ADJACENT TO ULCER; COLD BIOPSY:  - ANTRAL-TYPE MUCOSA WITH MILD CHRONIC GASTRITIS, SEE COMMENT.  - REACTIVE FOVEOLAR HYPERPLASIA.  - NEGATIVE FOR HELICOBACTER PYLORI BY IMMUNOHISTOCHEMISTRY (IHC).   Comment:  There are a few foci of gastric glandular injury surrounded by  lymphocytes, eosinophils, and macrophages. Neutrophils are not  identified. This focally enhanced gastritis pattern is not specific in  adults but can be a medication effect, and can be seen in inflammatory  bowel disease. No granulomas or viral inclusions are seen. One tiny  crushed mucosal fragment shows a few goblet cells but I cannot be sure  of the type of tissue. Control for H pylori IHC stained appropriately.   D. RIGHT COLON; RANDOM COLD BIOPSY:  - MILD ACTIVE COLITIS WITH FEATURES OF CHRONICITY.   E. COLON POLYP, ASCENDING; COLD BIOPSY:  - CHRONIC ULCER IN A BACKGROUND OF ACTIVE COLITIS, SEE COMMENT.   F. TRANSVERSE COLON; RANDOM COLD BIOPSY:  - MILD ACTIVE COLITIS WITH FEATURES OF CHRONICITY.   G. SIGMOID COLON; RANDOM COLD BIOPSY:  - MILD ACTIVE COLITIS WITH  FEATURES OF CHRONICITY.   H. RECTUM; RANDOM COLD BIOPSY:  - MILD ACTIVE PROCTITIS WITH FEATURES OF CHRONICITY.   Comment:  All of the samples are negative for dysplasia and malignancy.    Colonoscopy 03/27/19 - Preparation of the colon was fair. - Perianal skin tags found on perianal exam. - The examined portion of the ileum was normal. Biopsied. 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 Biopsied. - Severe (Mayo Score 3) left-sided ulcerative colitis, worsened since the last examination. Biopsied. - Inactive (Mayo Score 0) ulcerative colitis, improved since the last examination. Biopsied. - Non-bleeding external hemorrhoids. DIAGNOSIS:  A. TERMINAL ILEUM; COLD BIOPSY:  - ENTERIC MUCOSA WITH NORMAL VILLOUS ARCHITECTURE AND NO SIGNIFICANT  HISTOPATHOLOGIC CHANGE.  - NEGATIVE FOR ACTIVE MUCOSAL ENTERITIS.  - NEGATIVE FOR GRANULOMA, DYSPLASIA, AND MALIGNANCY.   B. COLON, RIGHT; COLD BIOPSY:  - CHRONIC COLITIS WITHOUT MUCOSAL ACTIVITY.  - NEGATIVE FOR GRANULOMA, DYSPLASIA, AND MALIGNANCY.   C. COLON, TRANSVERSE; COLD BIOPSY:  - CHRONIC COLITIS WITHOUT MUCOSAL ACTIVITY.  - NEGATIVE FOR GRANULOMA, DYSPLASIA, AND MALIGNANCY.   D. COLON, LEFT; COLD BIOPSY:  - CHRONIC COLITIS WITHOUT MUCOSAL ACTIVITY.  - NEGATIVE FOR GRANULOMA, DYSPLASIA, AND MALIGNANCY.   E. COLON, RECTOSIGMOID STRICTURE; COLD BIOPSY:  - CHRONIC PROCTO-COLITIS WITH MODERATE ACTIVITY (CRYPTITIS AND FOCAL  CRYPT ABSCESS; NO DEFINITE ULCERATION OR CRYPT DROPOUT).  - NEGATIVE FOR GRANULOMA, DYSPLASIA, AND MALIGNANCY.   F. RECTUM; COLD BIOPSY:  - CHRONIC PROCTITIS WITH MILD ACTIVITY (CRYPTITIS).  - NEGATIVE FOR GRANULOMA, DYSPLASIA, AND MALIGNANCY.   Colonoscopy 03/30/2020 - Preparation of the colon was fair. - Perianal skin tags found on perianal exam. - The examined portion of the ileum was normal. - Inactive (Mayo Score 0) ulcerative colitis, in remission, improved since the last examination. Biopsied. 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 - The distal rectum and anal verge are normal on retroflexion view. DIAGNOSIS:  A. COLON, RANDOM RIGHT; COLD BIOPSY:  - BENIGN COLONIC MUCOSA WITH MINIMAL  ARCHITECTURAL CHANGES OF CHRONIC  COLITIS, WITHOUT MUCOSAL ACTIVITY.  - NEGATIVE FOR GRANULOMA, DYSPLASIA, AND MALIGNANCY.   B. COLON, RANDOM LEFT; COLD BIOPSY:  - BENIGN COLONIC MUCOSA WITH MINIMAL ARCHITECTURAL CHANGES OF CHRONIC  COLITIS, WITHOUT MUCOSAL ACTIVITY.  - NEGATIVE FOR GRANULOMA, DYSPLASIA, AND MALIGNANCY.   C. RECTUM; COLD BIOPSY:  - BENIGN RECTAL MUCOSA WITH MINIMAL ARCHITECTURAL CHANGES OF CHRONIC  PROCTITIS, WITHOUT MUCOSAL ACTIVITY.  - SINGLE MINUTE NON-NECROTIZING GRANULOMA.  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, single   Past Medical History:  Diagnosis Date   Iron deficiency anemia 04/07/2017   Lower abdominal pain 12/13/2016   Postprandial diarrhea 12/13/2016   Previous cesarean section 2017   non reassuring FHT   Ulcerative colitis ([place])    Weight loss 12/13/2016    Past Surgical History:  Procedure Laterality Date   CESAREAN SECTION N/A 01/06/2016   Procedure: CESAREAN SECTION;  Surgeon: Malachy Mood, MD;  Location: [facility] ORS;  Service: Obstetrics;  Laterality: N/A;   COLONOSCOPY WITH PROPOFOL N/A 12/27/2016   Procedure: COLONOSCOPY WITH PROPOFOL;  Surgeon: Jonathon Bellows, MD;  Location[facility] ENDOSCOPY;  Service: Gastroenterology;  Laterality: N/A;   COLONOSCOPY WITH PROPOFOL N/A 03/27/2019   Procedure: COLONOSCOPY WITH PROPOFOL;  Surgeon: Lin Landsman, MD;  Location: [facility] ENDOSCOPY;  Service: Gastroenterology;  Laterality: N/A;   COLONOSCOPY WITH PROPOFOL N/A 03/30/2020  Procedure: COLONOSCOPY WITH PROPOFOL;  Surgeon: Lin Landsman, MD;  Location: [facility] ENDOSCOPY;  Service: Gastroenterology;  Laterality: N/A;   ESOPHAGOGASTRODUODENOSCOPY (EGD) WITH PROPOFOL N/A 12/27/2016   Procedure: ESOPHAGOGASTRODUODENOSCOPY (EGD) WITH PROPOFOL;  Surgeon:  Jonathon Bellows, MD;  Location: [facility] ENDOSCOPY;  Service: Gastroenterology;  Laterality: N/A;    Current Outpatient Medications:    inFLIXimab-dyyb (INFLECTRA) 100 MG SOLR, Inject 100 mg into the vein every 8 (eight) weeks., Disp: , Rfl:    Vitamin D, Ergocalciferol, (DRISDOL) 1.25 MG (50000 UNIT) CAPS capsule, Take 1 capsule (50,000 Units total) by mouth every 7 (seven) days., Disp: 8 capsule, Rfl: 0   Zoster Vaccine Adjuvanted (SHINGRIX) injection, Inject 0.5 mLs into the muscle once for 1 dose., Disp: 1 each, Rfl: 0    Family History  Problem Relation Age of Onset   Diabetes Maternal Grandmother    Liver cancer Maternal Grandfather    Colitis Paternal Grandmother    Stroke Neg Hx    Breast cancer Neg Hx    Ovarian cancer Neg Hx      Social History   Tobacco Use   Smoking status: Former    Types: Cigarettes    Quit date: 11/24/2016    Years since quitting: 4.9   Smokeless tobacco: Never   Tobacco comments:    has not smoked in 1-2 weeks  Vaping Use   Vaping Use: Never used  Substance Use Topics   Alcohol use: Yes    Comment: occasional glass of wine   Drug use: Never    Allergies as of 11/03/2021   (No Known Allergies)    Review of Systems:    All systems reviewed and negative except where noted in HPI.   Physical Exam:  BP 132/83 (BP Location: Left Arm, Patient Position: Sitting, Cuff Size: Normal)   Pulse 92   Temp 98.6 F (37 C) (Oral)   Ht 5' 4"  (1.626 m)   Wt 147 lb 6 oz (66.8 kg)   BMI 25.30 kg/m  No LMP recorded.  General:   Alert, well-built, pleasant and cooperative in NAD Head:  Normocephalic and atraumatic. Eyes:  Sclera clear, no icterus.   Conjunctiva pink. Ears:  Normal auditory acuity. Nose:  No deformity, discharge, or lesions. Mouth:  No deformity or lesions,oropharynx pink & moist. Neck:  Supple; no masses or thyromegaly. Lungs:  Respirations even and unlabored.  Clear throughout to auscultation.   No wheezes, crackles, or rhonchi. No  acute distress. Heart:  Regular rate and rhythm; no murmurs, clicks, rubs, or gallops. Abdomen:  Normal bowel sounds. Soft, nontender and non-distended without masses, hepatosplenomegaly or hernias noted.  No guarding or rebound tenderness.   Rectal: Nor performed Msk:  Symmetrical without gross deformities. Good, equal movement & strength bilaterally. Pulses:  Normal pulses noted. Extremities:  No clubbing or edema.  No cyanosis. Neurologic:  Alert and oriented x3;  grossly normal neurologically. Skin:  Intact without significant lesions or rashes. No jaundice. Psych:  Alert and cooperative. Normal mood and affect.  Imaging Studies: None  Assessment and Plan:   Christina Obrien is a 41 y.o. African-American female is here follow-up ulcerative pancolitis in 12/2016.  She had significantly elevated fecal calprotectin 475, ESR 72. Her stool studies were negative for infection.  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 Anemia: History of severe iron deficiency anemia, status post parenteral iron therapy, anemia resolved.  Ferritin and B12 levels restored. 3.Immunizations: Hep A and B negative serologies, received Twinrix vaccine 3 doses, Influenza recommend but patient does not want flu vaccine, prevnar received in 10/2017, pneumovax received in 12/2018, varicella-had chickenpox as a child, she reports receiving Shingrix vaccine ending of 2023, advised booster dose and prescription  sent 4.Cancer screening I) Colon cancer/dysplasia surveillance: Up-to-date II) Cervical cancer: Pap smear negative to date, has an appointment in December 2023 III) Skin cancer - counseled about annual skin exam by dermatology and skin protection in summer using sun screen SPF > 50, clothing 5.Bone health Vitamin D status: Normal vitamin D levels Bone density testing:  Not done 5. Labs:  today 6. Smoking: quit smoking in 2021 7. NSAIDs and Antibiotics use: continue to avoid   Follow up annually or sooner if needed    [name], MD

## 2021-11-04 ENCOUNTER — Telehealth: Payer: Self-pay

## 2021-11-04 NOTE — Telephone Encounter (Signed)
[facility] infusion request the last office visit notes for patient to be fax to them. Faxed this to [phone]

## 2021-11-29 DIAGNOSIS — K5100 Ulcerative (chronic) pancolitis without complications: Secondary | ICD-10-CM | POA: Diagnosis not present

## 2021-12-01 DIAGNOSIS — K5100 Ulcerative (chronic) pancolitis without complications: Secondary | ICD-10-CM | POA: Diagnosis not present

## 2022-01-25 DIAGNOSIS — K5100 Ulcerative (chronic) pancolitis without complications: Secondary | ICD-10-CM | POA: Diagnosis not present

## 2022-02-01 ENCOUNTER — Telehealth: Payer: Self-pay

## 2022-02-01 DIAGNOSIS — E875 Hyperkalemia: Secondary | ICD-10-CM

## 2022-02-01 NOTE — Telephone Encounter (Signed)
-----   Message from Lin Landsman, MD sent at 02/01/2022  2:44 PM EST ----- Please inform patient that her potassium is elevated.  Recommend to recheck potassium ASAP  RV

## 2022-02-01 NOTE — Telephone Encounter (Signed)
Called and left a message for call back  

## 2022-02-02 DIAGNOSIS — E875 Hyperkalemia: Secondary | ICD-10-CM | POA: Diagnosis not present

## 2022-02-02 NOTE — Telephone Encounter (Signed)
Order labs. Had a voicemail from patient to call patient back. Will call patient at 8.

## 2022-02-02 NOTE — Telephone Encounter (Signed)
Patient states she will go the lab this afternoon around 4 to have the labs done. Order the labs as STAT

## 2022-02-03 LAB — POTASSIUM: Potassium: 3.9 mmol/L (ref 3.5–5.2)

## 2022-02-07 NOTE — Telephone Encounter (Signed)
Called and left a message for call back  

## 2022-02-07 NOTE — Telephone Encounter (Signed)
Can you please review patient potassium levels we had her repeat them last week due to her potassium being high.

## 2022-02-07 NOTE — Telephone Encounter (Signed)
Called and informed patient and patient verbalized understanding of results

## 2022-03-21 DIAGNOSIS — K5100 Ulcerative (chronic) pancolitis without complications: Secondary | ICD-10-CM | POA: Diagnosis not present

## 2022-03-28 DIAGNOSIS — K5100 Ulcerative (chronic) pancolitis without complications: Secondary | ICD-10-CM | POA: Diagnosis not present

## 2022-05-23 DIAGNOSIS — K5100 Ulcerative (chronic) pancolitis without complications: Secondary | ICD-10-CM | POA: Diagnosis not present

## 2022-05-26 ENCOUNTER — Other Ambulatory Visit: Payer: Self-pay

## 2022-07-18 DIAGNOSIS — K5100 Ulcerative (chronic) pancolitis without complications: Secondary | ICD-10-CM | POA: Diagnosis not present

## 2022-07-25 DIAGNOSIS — K5100 Ulcerative (chronic) pancolitis without complications: Secondary | ICD-10-CM | POA: Diagnosis not present

## 2022-09-19 DIAGNOSIS — K5100 Ulcerative (chronic) pancolitis without complications: Secondary | ICD-10-CM | POA: Diagnosis not present

## 2022-09-28 ENCOUNTER — Telehealth: Payer: Self-pay

## 2022-09-28 NOTE — Telephone Encounter (Signed)
[facility] infusion was requesting last office visit and labs. Printed and faxed to them at [phone]

## 2022-11-07 ENCOUNTER — Ambulatory Visit: Payer: BC Managed Care – PPO | Admitting: Gastroenterology

## 2022-11-07 ENCOUNTER — Encounter: Payer: Self-pay | Admitting: Gastroenterology

## 2022-11-07 VITALS — BP 106/66 | HR 75 | Temp 98.4°F | Ht 64.0 in | Wt 145.1 lb

## 2022-11-07 DIAGNOSIS — Z87891 Personal history of nicotine dependence: Secondary | ICD-10-CM | POA: Diagnosis not present

## 2022-11-07 DIAGNOSIS — Z862 Personal history of diseases of the blood and blood-forming organs and certain disorders involving the immune mechanism: Secondary | ICD-10-CM | POA: Diagnosis not present

## 2022-11-07 DIAGNOSIS — K5100 Ulcerative (chronic) pancolitis without complications: Secondary | ICD-10-CM | POA: Diagnosis not present

## 2022-11-07 MED ORDER — SHINGRIX 50 MCG/0.5ML IM SUSR: 0.5000 mL | Freq: Once | INTRAMUSCULAR | 0 refills | Status: AC

## 2022-11-07 NOTE — Progress Notes (Signed)
Arlyss Repress, MD [address]  [address]  [place], [place] [address]  Main: [phone]  Fax: [phone]    Gastroenterology Consultation  Referring Provider:     No ref. provider found Primary Care Physician:  Farrel Conners, CNM (Inactive) Primary Gastroenterologist:  Dr. Arlyss Repress Reason for Consultation:   Panulcerative colitis        HPI:   Christina Obrien is a 42 y.o. female referred by [facility]  for consultation & management of panulcerative colitis.  Patient is diagnosed with ulcerative colitis in 12/2016, previously maintained on mesalamine, later switched to Remicade due to progression of disease.  Patient is currently maintained on Inflectra 5 mg/kg body weight every 8 weeks. Patient reports that she has been tolerating it well. She has been gaining weight.  She denies abdominal pain, nausea, rectal bleeding.  She does not have any GI concerns today She underwent colonoscopy in 3/22 when she was on Inflectra, found to have significant improvement in her inflammation.  Her pathology results confirmed minimal activity, presence of single minute nonnecrotizing granuloma in the rectal biopsies.  Follow-up visit 11/07/2022 Patient is here for follow-up of ulcerative colitis.  She does not have any concerns with regards to colitis.  Has scheduled next infusion in 2 weeks.  Denies any GI symptoms.  She did not receive this booster Shingrix.  She did not follow-up with OB/GYN for Pap smear she is overdue for about a year   Antiplts/Anticoagulants/Anti thrombotics: none  NSAIDs: she used to take ibuprofen about once a week or less for occasional headaches She denies taking any over-the-counter supplements other than multivitamin   GI Procedures:  EGD and colonoscopy on 12/27/2016 by Dr. Tobi Bastos The esophagus was normal. Patchy moderate inflammation characterized by adherent blood, congestion (edema), erosions and erythema was found in the gastric antrum.  Biopsies were taken with a cold forceps for histology. Two non-bleeding superficial gastric ulcers with no stigmata of bleeding were found in the stomach. The largest lesion was 4 mm in largest dimension. Biopsies were taken with a cold forceps for histology. Duodenum was inflamed  Colonoscopy - Preparation of the colon was poor. - Patchy moderate inflammation was found in the entire examined colon secondary to colitis. Biopsied. - One 6 mm polyp in the ascending colon, removed with a cold biopsy forceps. Resected and retrieved. - The examination was otherwise normal on direct and retroflexion views. DIAGNOSIS:  A. 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  - NEGATIVE FOR ACTIVE INFLAMMATION, INTRAEPITHELIAL LYMPHOCYTOSIS, AND  INFECTIOUS AGENTS.   B. DUODENUM, THIRD PORTION; COLD BIOPSY:  - DUODENAL MUCOSA WITH INTACT VILLI.  - NEGATIVE FOR ACTIVE INFLAMMATION, INTRAEPITHELIAL LYMPHOCYTOSIS, AND  INFECTIOUS AGENTS.   C. STOMACH ADJACENT TO ULCER; COLD BIOPSY:  - ANTRAL-TYPE MUCOSA WITH MILD CHRONIC GASTRITIS, SEE COMMENT.  - REACTIVE FOVEOLAR HYPERPLASIA.  - NEGATIVE FOR HELICOBACTER PYLORI BY IMMUNOHISTOCHEMISTRY (IHC).   Comment:  There are a few foci of gastric glandular injury surrounded by  lymphocytes, eosinophils, and macrophages. Neutrophils are not  identified. This focally enhanced gastritis pattern is not specific in  adults but can be a medication effect, and can be seen in inflammatory  bowel disease. No granulomas or viral inclusions are seen. One tiny  crushed mucosal fragment shows a few goblet cells but I cannot be sure  of the type of tissue. Control for H pylori IHC stained appropriately.   D. RIGHT COLON; RANDOM COLD  BIOPSY:  - MILD ACTIVE COLITIS WITH FEATURES OF CHRONICITY.   E. COLON POLYP, ASCENDING; COLD BIOPSY:  - CHRONIC ULCER IN A BACKGROUND OF ACTIVE COLITIS, SEE COMMENT.   F. TRANSVERSE COLON; RANDOM  COLD BIOPSY:  - MILD ACTIVE COLITIS WITH FEATURES OF CHRONICITY.   G. SIGMOID COLON; RANDOM COLD BIOPSY:  - MILD ACTIVE COLITIS WITH FEATURES OF CHRONICITY.   H. RECTUM; RANDOM COLD BIOPSY:  - MILD ACTIVE PROCTITIS WITH FEATURES OF CHRONICITY.   Comment:  All of the samples are negative for dysplasia and malignancy.   Colonoscopy 03/27/19 - Preparation of the colon was fair. - Perianal skin tags found on perianal exam. - The examined portion of the ileum was normal. Biopsied. 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 Biopsied. - Severe (Mayo Score 3) left-sided ulcerative colitis, worsened since the last examination. Biopsied. - Inactive (Mayo Score 0) ulcerative colitis, improved since the last examination. Biopsied. - Non-bleeding external hemorrhoids. DIAGNOSIS:  A. TERMINAL ILEUM; COLD BIOPSY:  - ENTERIC MUCOSA WITH NORMAL VILLOUS ARCHITECTURE AND NO SIGNIFICANT  HISTOPATHOLOGIC CHANGE.  - NEGATIVE FOR ACTIVE MUCOSAL ENTERITIS.  - NEGATIVE FOR GRANULOMA, DYSPLASIA, AND MALIGNANCY.   B. COLON, RIGHT; COLD BIOPSY:  - CHRONIC COLITIS WITHOUT MUCOSAL ACTIVITY.  - NEGATIVE FOR GRANULOMA, DYSPLASIA, AND MALIGNANCY.   C. COLON, TRANSVERSE; COLD BIOPSY:  - CHRONIC COLITIS WITHOUT MUCOSAL ACTIVITY.  - NEGATIVE FOR GRANULOMA, DYSPLASIA, AND MALIGNANCY.   D. COLON, LEFT; COLD BIOPSY:  - CHRONIC COLITIS WITHOUT MUCOSAL ACTIVITY.  - NEGATIVE FOR GRANULOMA, DYSPLASIA, AND MALIGNANCY.   E. COLON, RECTOSIGMOID STRICTURE; COLD BIOPSY:  - CHRONIC PROCTO-COLITIS WITH MODERATE ACTIVITY (CRYPTITIS AND FOCAL  CRYPT ABSCESS; NO DEFINITE ULCERATION OR CRYPT DROPOUT).  - NEGATIVE FOR GRANULOMA, DYSPLASIA, AND MALIGNANCY.   F. RECTUM; COLD BIOPSY:  - CHRONIC PROCTITIS WITH MILD ACTIVITY (CRYPTITIS).  - NEGATIVE FOR GRANULOMA, DYSPLASIA, AND MALIGNANCY.   Colonoscopy 03/30/2020 - Preparation of the colon was fair. - Perianal skin tags found on perianal exam. - The examined portion of the ileum  was normal. - Inactive (Mayo Score 0) ulcerative colitis, in remission, improved since the last examination. Biopsied. 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 - The distal rectum and anal verge are normal on retroflexion view. DIAGNOSIS:  A. COLON, RANDOM RIGHT; COLD BIOPSY:  - BENIGN COLONIC MUCOSA WITH MINIMAL ARCHITECTURAL CHANGES OF CHRONIC  COLITIS, WITHOUT MUCOSAL ACTIVITY.  - NEGATIVE FOR GRANULOMA, DYSPLASIA, AND MALIGNANCY.   B. COLON, RANDOM LEFT; COLD BIOPSY:  - BENIGN COLONIC MUCOSA WITH MINIMAL ARCHITECTURAL CHANGES OF CHRONIC  COLITIS, WITHOUT MUCOSAL ACTIVITY.  - NEGATIVE FOR GRANULOMA, DYSPLASIA, AND MALIGNANCY.   C. RECTUM; COLD BIOPSY:  - BENIGN RECTAL MUCOSA WITH MINIMAL ARCHITECTURAL CHANGES OF CHRONIC  PROCTITIS, WITHOUT MUCOSAL ACTIVITY.  - SINGLE MINUTE NON-NECROTIZING GRANULOMA.  - NEGATIVE FOR DYSPLASIA AND MALIGNANCY  Denies having any GI surgeries Smokes cigarettes occasional  ETOH social, denies illicit drug use Father with inflammatory bowel disease, GI malignancy Works as a Leisure centre manager in [employer] 2 kids, single   Past Medical History:  Diagnosis Date   Iron deficiency anemia 04/07/2017   Lower abdominal pain 12/13/2016   Postprandial diarrhea 12/13/2016   Previous cesarean section 2017   non reassuring FHT   Ulcerative colitis (HCC)    Weight loss 12/13/2016    Past Surgical History:  Procedure Laterality Date   CESAREAN SECTION N/A 01/06/2016   Procedure: CESAREAN SECTION;  Surgeon: Vena Austria, MD;  Location: [facility] ORS;  Service: Obstetrics;  Laterality: N/A;   COLONOSCOPY  WITH PROPOFOL N/A 12/27/2016   Procedure: COLONOSCOPY WITH PROPOFOL;  Surgeon: Wyline Mood, MD;  Location: [facility] ENDOSCOPY;  Service: Gastroenterology;  Laterality: N/A;   COLONOSCOPY WITH PROPOFOL N/A 03/27/2019   Procedure: COLONOSCOPY WITH PROPOFOL;  Surgeon: Toney Reil, MD;  Location: [facility] ENDOSCOPY;  Service: Gastroenterology;  Laterality: N/A;    COLONOSCOPY WITH PROPOFOL N/A 03/30/2020   Procedure: COLONOSCOPY WITH PROPOFOL;  Surgeon: Toney Reil, MD;  Location: [facility] ENDOSCOPY;  Service: Gastroenterology;  Laterality: N/A;   ESOPHAGOGASTRODUODENOSCOPY (EGD) WITH PROPOFOL N/A 12/27/2016   Procedure: ESOPHAGOGASTRODUODENOSCOPY (EGD) WITH PROPOFOL;  Surgeon: Wyline Mood, MD;  Location: [facility] ENDOSCOPY;  Service: Gastroenterology;  Laterality: N/A;    Current Outpatient Medications:    inFLIXimab-dyyb (INFLECTRA) 100 MG SOLR, Inject 100 mg into the vein every 8 (eight) weeks., Disp: , Rfl:    Zoster Vaccine Adjuvanted (SHINGRIX) injection, Inject 0.5 mLs into the muscle once for 1 dose., Disp: 1 each, Rfl: 0    Family History  Problem Relation Age of Onset   Diabetes Maternal Grandmother    Liver cancer Maternal Grandfather    Colitis Paternal Grandmother    Stroke Neg Hx    Breast cancer Neg Hx    Ovarian cancer Neg Hx      Social History   Tobacco Use   Smoking status: Former    Current packs/day: 0.00    Types: Cigarettes    Quit date: 11/24/2016    Years since quitting: 5.9   Smokeless tobacco: Never   Tobacco comments:    has not smoked in 1-2 weeks  Vaping Use   Vaping status: Never Used  Substance Use Topics   Alcohol use: Yes    Comment: occasional glass of wine   Drug use: Never    Allergies as of 11/07/2022   (No Known Allergies)    Review of Systems:    All systems reviewed and negative except where noted in HPI.   Physical Exam:  BP 106/66 (BP Location: Left Arm, Patient Position: Sitting, Cuff Size: Normal)   Pulse 75   Temp 98.4 F (36.9 C) (Oral)   Ht 5\' 4"  (1.626 m)   Wt 145 lb 2 oz (65.8 kg)   BMI 24.91 kg/m  No LMP recorded.  General:   Alert, well-built, pleasant and cooperative in NAD Head:  Normocephalic and atraumatic. Eyes:  Sclera clear, no icterus.   Conjunctiva pink. Ears:  Normal auditory acuity. Nose:  No deformity, discharge, or lesions. Mouth:  No deformity or  lesions,oropharynx pink & moist. Neck:  Supple; no masses or thyromegaly. Lungs:  Respirations even and unlabored.  Clear throughout to auscultation.   No wheezes, crackles, or rhonchi. No acute distress. Heart:  Regular rate and rhythm; no murmurs, clicks, rubs, or gallops. Abdomen:  Normal bowel sounds. Soft, nontender and non-distended without masses, hepatosplenomegaly or hernias noted.  No guarding or rebound tenderness.   Rectal: Nor performed Msk:  Symmetrical without gross deformities. Good, equal movement & strength bilaterally. Pulses:  Normal pulses noted. Extremities:  No clubbing or edema.  No cyanosis. Neurologic:  Alert and oriented x3;  grossly normal neurologically. Skin:  Intact without significant lesions or rashes. No jaundice. Psych:  Alert and cooperative. Normal mood and affect.  Imaging Studies: None  Assessment and Plan:   Christina Obrien is a 42 y.o. African-American female is here follow-up ulcerative pancolitis in 12/2016.  She had significantly elevated fecal calprotectin 475, ESR 72. Her stool studies were negative for infection.  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 Anemia: History of severe iron deficiency anemia, status post parenteral iron therapy, anemia resolved.  Ferritin and B12 levels restored. 3.Immunizations: Hep A and B negative serologies, received Twinrix vaccine 3 doses, Influenza recommend  but patient does not want flu vaccine, prevnar received in 10/2017, pneumovax received in 12/2018, varicella-had chickenpox as a child, she reports receiving Shingrix vaccine ending of 2023, advised booster dose and prescription sent 4.Cancer screening I) Colon cancer/dysplasia surveillance: Up-to-date II) Cervical cancer: Pap smear negative to date, reminded patient to follow-up with OB/GYN for Pap smear which she is past due since December 2023 III) Skin cancer - counseled about annual skin exam by dermatology and skin protection in summer using sun screen SPF > 50, clothing 5.Bone health Vitamin D status: Normal vitamin D levels Bone density testing:  Not done 5. Labs:  today 6. Smoking: quit smoking in 2021 7. NSAIDs and Antibiotics use: continue to avoid   Follow up annually or sooner if needed    Arlyss Repress, MD

## 2022-11-21 DIAGNOSIS — K5100 Ulcerative (chronic) pancolitis without complications: Secondary | ICD-10-CM | POA: Diagnosis not present

## 2022-11-22 DIAGNOSIS — K5100 Ulcerative (chronic) pancolitis without complications: Secondary | ICD-10-CM | POA: Diagnosis not present

## 2022-11-24 LAB — LAB REPORT - SCANNED: EGFR: 77

## 2023-01-20 DIAGNOSIS — K5100 Ulcerative (chronic) pancolitis without complications: Secondary | ICD-10-CM | POA: Diagnosis not present

## 2023-01-30 ENCOUNTER — Telehealth: Payer: Self-pay

## 2023-01-30 DIAGNOSIS — D649 Anemia, unspecified: Secondary | ICD-10-CM

## 2023-01-30 NOTE — Telephone Encounter (Signed)
 Patient verbalized understanding she will come for lab work this week

## 2023-01-30 NOTE — Telephone Encounter (Signed)
-----   Message from [facility] sent at 01/29/2023 11:40 PM EST ----- Hb is low, recommend iron panel, B 12 and folate levels, recheck cbc and lfts in 3 months  RV

## 2023-02-01 DIAGNOSIS — D649 Anemia, unspecified: Secondary | ICD-10-CM | POA: Diagnosis not present

## 2023-02-02 LAB — IRON,TIBC AND FERRITIN PANEL
Ferritin: 8 ng/mL — ABNORMAL LOW (ref 15–150)
Iron Saturation: 17 % (ref 15–55)
Iron: 60 ug/dL (ref 27–159)
Total Iron Binding Capacity: 355 ug/dL (ref 250–450)
UIBC: 295 ug/dL (ref 131–425)

## 2023-02-02 LAB — B12 AND FOLATE PANEL
Folate: 8.1 ng/mL (ref >3.0)
Vitamin B-12: 385 pg/mL (ref 232–1245)

## 2023-02-03 ENCOUNTER — Telehealth: Payer: Self-pay

## 2023-02-03 NOTE — Telephone Encounter (Signed)
 Patient verbalized understanding of results. She states she would like the IV Iron  Infusion again and would like to see Dr. Melanee. Informed patient would would reach out to her and there office would call her with a appointment. She states her Ulcerative colitis is under control and she is not having no symptoms.

## 2023-02-03 NOTE — Telephone Encounter (Signed)
-----   Message from [facility] sent at 02/03/2023 11:32 AM EST ----- Labs revealed severe iron  deficiency anemia.  She was seen by Dr. Melanee in the past for IV iron .  Please check with patient if she would like to continue with IV iron  or try oral iron , fusion plus pill every other day for 3 months  Also, please check with her if she is having any symptoms of ulcerative colitis  RV

## 2023-02-03 NOTE — Telephone Encounter (Signed)
 This will be a new patient for me. Fit her in somewhere in the next 1-2 weeks please

## 2023-02-14 ENCOUNTER — Encounter: Payer: Self-pay | Admitting: Oncology

## 2023-02-14 ENCOUNTER — Inpatient Hospital Stay: Payer: BC Managed Care – PPO

## 2023-02-14 ENCOUNTER — Inpatient Hospital Stay: Payer: BC Managed Care – PPO | Attending: Oncology | Admitting: Oncology

## 2023-02-14 VITALS — BP 117/73

## 2023-02-14 VITALS — BP 112/59 | HR 68 | Temp 97.0°F | Resp 16 | Ht 64.0 in | Wt 143.0 lb

## 2023-02-14 DIAGNOSIS — K5190 Ulcerative colitis, unspecified, without complications: Secondary | ICD-10-CM | POA: Diagnosis not present

## 2023-02-14 DIAGNOSIS — Z87891 Personal history of nicotine dependence: Secondary | ICD-10-CM | POA: Diagnosis not present

## 2023-02-14 DIAGNOSIS — D509 Iron deficiency anemia, unspecified: Secondary | ICD-10-CM

## 2023-02-14 MED ORDER — IRON SUCROSE 20 MG/ML IV SOLN
200.0000 mg | INTRAVENOUS | Status: DC
  Administered 2023-02-14: 200 mg via INTRAVENOUS

## 2023-02-14 NOTE — Patient Instructions (Signed)

## 2023-02-14 NOTE — Progress Notes (Signed)
Hematology/Oncology Consult note [facility] Telephone:([phone] [phone]  Patient Care Team: Farrel Conners, CNM (Inactive) as PCP - General (Certified Nurse Midwife)   Name of the patient: Christina Obrien  [id number]  [birth year]    Reason for referral-iron deficiency anemia   Referring physician-Dr. Allegra Lai  Date of visit: 02/14/23   History of presenting illness- Patient is a 43 year old female with history of ulcerative colitis diagnosed in 2018 and presently sees Dr. Allegra Lai for the same.  She is currently on Inflectra every 8 weeks.  She has been referred for iron deficiency anemia.  CBC from October 2024 showed H&H of 9.9/32.3 with an MCV of 89.  White count and platelets were normal.  Iron studies from 02/01/2023 showed a low ferritin of 8 and iron saturation of 17%.  B12 levels were normal at 385 and folate was normal.  With regards to her inflammatory bowel disease she denies any blood loss in her stool or urine.  Denies any diarrhea.  She is premenopausal and menstrual cycles last for 4 to 5 days and the first couple of days are particularly heavy.  She has tried oral iron in the past and has not worked for her.  She has reacted to [facility] in the past.  Presently she reports ongoing fatigue  ECOG PS- 0  Pain scale- 0   Review of systems- Review of Systems  Constitutional:  Positive for malaise/fatigue. Negative for chills, fever and weight loss.  HENT:  Negative for congestion, ear discharge and nosebleeds.   Eyes:  Negative for blurred vision.  Respiratory:  Negative for cough, hemoptysis, sputum production, shortness of breath and wheezing.   Cardiovascular:  Negative for chest pain, palpitations, orthopnea and claudication.  Gastrointestinal:  Negative for abdominal pain, blood in stool, constipation, diarrhea, heartburn, melena, nausea and vomiting.  Genitourinary:  Negative for dysuria, flank pain, frequency, hematuria and urgency.   Musculoskeletal:  Negative for back pain, joint pain and myalgias.  Skin:  Negative for rash.  Neurological:  Negative for dizziness, tingling, focal weakness, seizures, weakness and headaches.  Endo/Heme/Allergies:  Does not bruise/bleed easily.  Psychiatric/Behavioral:  Negative for depression and suicidal ideas. The patient does not have insomnia.     No Known Allergies  Patient Active Problem List   Diagnosis Date Noted   Ulcerative colitis (HCC) 11/27/2017     Past Medical History:  Diagnosis Date   Iron deficiency anemia 04/07/2017   Lower abdominal pain 12/13/2016   Postprandial diarrhea 12/13/2016   Previous cesarean section 2017   non reassuring FHT   Ulcerative colitis (HCC)    Weight loss 12/13/2016     Past Surgical History:  Procedure Laterality Date   CESAREAN SECTION N/A 01/06/2016   Procedure: CESAREAN SECTION;  Surgeon: Vena Austria, MD;  Location: [facility] ORS;  Service: Obstetrics;  Laterality: N/A;   COLONOSCOPY WITH PROPOFOL N/A 12/27/2016   Procedure: COLONOSCOPY WITH PROPOFOL;  Surgeon: Wyline Mood, MD;  Location: [facility] ENDOSCOPY;  Service: Gastroenterology;  Laterality: N/A;   COLONOSCOPY WITH PROPOFOL N/A 03/27/2019   Procedure: COLONOSCOPY WITH PROPOFOL;  Surgeon: Toney Reil, MD;  Location: [facility] ENDOSCOPY;  Service: Gastroenterology;  Laterality: N/A;   COLONOSCOPY WITH PROPOFOL N/A 03/30/2020   Procedure: COLONOSCOPY WITH PROPOFOL;  Surgeon: Toney Reil, MD;  Location: [facility] ENDOSCOPY;  Service: Gastroenterology;  Laterality: N/A;   ESOPHAGOGASTRODUODENOSCOPY (EGD) WITH PROPOFOL N/A 12/27/2016   Procedure: ESOPHAGOGASTRODUODENOSCOPY (EGD) WITH PROPOFOL;  Surgeon: Wyline Mood, MD;  Location: [facility] ENDOSCOPY;  Service:  Gastroenterology;  Laterality: N/A;    Social History   Socioeconomic History   Marital status: Single    Spouse name: Not on file   Number of children: 2   Years of education: Not on file   Highest education level: Not  on file  Occupational History   Not on file  Tobacco Use   Smoking status: Former    Current packs/day: 0.00    Types: Cigarettes    Quit date: 11/24/2016    Years since quitting: 6.2   Smokeless tobacco: Never   Tobacco comments:    has not smoked in 1-2 weeks  Vaping Use   Vaping status: Never Used  Substance and Sexual Activity   Alcohol use: Yes    Comment: occasional glass of wine   Drug use: Never   Sexual activity: Yes    Partners: Male    Birth control/protection: None, Condom    Comment: occasional condoms  Other Topics Concern   Not on file  Social History Narrative   Not on file   Social Drivers of Health   Financial Resource Strain: Not on file  Food Insecurity: Not on file  Transportation Needs: Not on file  Physical Activity: Inactive (12/13/2016)   Exercise Vital Sign    Days of Exercise per Week: 0 days    Minutes of Exercise per Session: 0 min  Stress: No Stress Concern Present (12/13/2016)   [employer] of Occupational Health - Occupational Stress Questionnaire    Feeling of Stress : Only a little  Social Connections: Somewhat Isolated (12/13/2016)   Social Connection and Isolation Panel [NHANES]    Frequency of Communication with Friends and Family: More than three times a week    Frequency of Social Gatherings with Friends and Family: Twice a week    Attends Religious Services: More than 4 times per year    Active Member of [employer] or Organizations: No    Attends Banker Meetings: Never    Marital Status: Never married  Intimate Partner Violence: Not At Risk (12/13/2016)   Humiliation, Afraid, Rape, and Kick questionnaire    Fear of Current or Ex-Partner: No    Emotionally Abused: No    Physically Abused: No    Sexually Abused: No     Family History  Problem Relation Age of Onset   Diabetes Maternal Grandmother    Liver cancer Maternal Grandfather    Colitis Paternal Grandmother    Stroke Neg Hx    Breast cancer Neg Hx     Ovarian cancer Neg Hx      Current Outpatient Medications:    inFLIXimab-dyyb (INFLECTRA) 100 MG SOLR, Inject 100 mg into the vein every 8 (eight) weeks., Disp: , Rfl:    Physical exam:  Vitals:   02/14/23 1350  BP: (!) 112/59  Pulse: 68  Resp: 16  Temp: (!) 97 F (36.1 C)  TempSrc: Tympanic  SpO2: 99%  Weight: 143 lb (64.9 kg)  Height: 5\' 4"  (1.626 m)   Physical Exam Cardiovascular:     Rate and Rhythm: Normal rate and regular rhythm.     Heart sounds: Normal heart sounds.  Pulmonary:     Effort: Pulmonary effort is normal.     Breath sounds: Normal breath sounds.  Abdominal:     General: Bowel sounds are normal.     Palpations: Abdomen is soft.  Skin:    General: Skin is warm and dry.  Neurological:     Mental Status: She  is alert and oriented to person, place, and time.           Latest Ref Rng & Units 02/02/2022    2:23 PM  CMP  Potassium 3.5 - 5.2 mmol/L 3.9       Latest Ref Rng & Units 11/11/2020    3:21 PM  CBC  WBC 3.4 - 10.8 x10E3/uL 10.6   Hemoglobin 11.1 - 15.9 g/dL 16.1   Hematocrit 09.6 - 46.6 % 32.9   Platelets 150 - 450 x10E3/uL 185     No images are attached to the encounter.  No results found.  Assessment and plan- Patient is a 43 y.o. female referred for iron deficiency anemia  I do not have recent CBC other than the one from October 2024 when her hemoglobin was 9.9.She did however have recent iron studies from 02/01/2023 which showed a low ferritin level of 8.  Suspect iron deficiency secondary to underlying ulcerative colitis for which she follows up with GI.  I will plan to offer her IV iron at this time.  Discussed risks and benefits of IV iron including all but not limited to possible risk of infusion reaction.  She has reacted to [facility] in the past and therefore we will proceed with 5 doses of Venofer at this time.  CBC ferritin and iron studies in 2 and 4 months and I will see her back in 4 months   Thank you for this kind  referral and the opportunity to participate in the care of this patient   Visit Diagnosis 1. Iron deficiency anemia, unspecified iron deficiency anemia type     Dr. Owens Shark, MD, MPH [facility] [phone] 02/14/2023

## 2023-02-17 NOTE — Progress Notes (Unsigned)
GYNECOLOGY: ANNUAL EXAM   Subjective:    PCP: Farrel Conners, CNM (Inactive) Christina Obrien is a 43 y.o. female [phone] who presents for annual wellness visit. Pt reports she has some hemorrhoid concerns today.  Well Woman Visit:  GYN HISTORY:  Patient's last menstrual period was 02/06/2023 (exact date).     Menstrual History: OB History     Gravida  4   Para  2   Term  2   Preterm      AB  2   Living  2      SAB      IAB  2   Ectopic      Multiple      Live Births  2           Menarche age: 22  Patient's last menstrual period was 02/06/2023 (exact date). Period Cycle (Days): 28 Period Duration (Days): 5 Period Pattern: (!) Irregular Menstrual Flow: Moderate Menstrual Control: Thin pad, Panty liner, Maxi pad Menstrual Control Change Freq (Hours): 2-4 Dysmenorrhea: (!) Mild Dysmenorrhea Symptoms: Cramping, Headache   Intermenstrual bleeding, spotting, or discharge? NO Urinary incontinence? sometimes  Sexually active: yes Number of sexual partners: 1 Gender of sexual Partners: male Social History   Substance and Sexual Activity  Sexual Activity Yes   Partners: Male   Birth control/protection: None, Condom   Comment: occasional condoms   Contraceptive methods: no method Dyspareunia? no STI history: no STI/HIV testing or immunizations needed? Yes.     Health Maintenance: -Last pap: approximate date 04/09/19 and was normal NILM HPV NEGATIVE --> Any abnormals: NO -Last mammogram: needs --> Any abnormals? N/a -Last colon cancer screen: yes / Type: normal -Last DEXA scan: no -[facility] of Breast / Colon / Cervical cancer: no -Vaccines:  Immunization History  Administered Date(s) Administered   Hep A / Hep B 10/30/2017, 11/27/2017, 12/27/2018   MMR 01/08/2016   Pneumococcal Conjugate-13 10/30/2017   Pneumococcal Polysaccharide-23 12/27/2018   Last Tdap: up to date / Flu: declined / COVID: declined / Gardasil: unsure / Shingles  (50+): up to date / PCV20:  -Hep C screen: up to date -Last lipid / glucose screening: today  > Exercise: walking, moderately active > Dietary Supplements: Folate: No;  Calcium: No}; Vitamin D: No > Body mass index is 24.55 kg/m.  > Recent dental visit Yes.   > Seat Belt Use: Yes.   > Texting and driving? Yes.   > Guns in the house Yes.   > Recreational or other drug use: Occasional marijuana.   Social History   Tobacco Use   Smoking status: Former    Current packs/day: 0.00    Types: Cigarettes    Quit date: 11/24/2016    Years since quitting: 6.2   Smokeless tobacco: Never   Tobacco comments:    has not smoked in 1-2 weeks   smokes marijuana   Substance Use Topics   Alcohol use: Yes    Alcohol/week: 2.0 standard drinks of alcohol    Types: 2 Glasses of wine per week    Comment: occasional glass of wine   Occupation: [employer]     Lives with: son and his father    PHQ-2 Score: In last two weeks, how often have you felt: Little interest or pleasure in doing things: Not at all (0) Feeling down, depressed or hopeless: Not at all (0) Score: 0  GAD-2 Over the last 2 weeks, how often have you been bothered by the following  problems? Feeling nervous, anxious or on edge: Not at all (0) Not being able to stop or control worrying: Not at all (0)} Score: 0 _________________________________________________________  Current Outpatient Medications  Medication Sig Dispense Refill   inFLIXimab-dyyb (INFLECTRA) 100 MG SOLR Inject 100 mg into the vein every 8 (eight) weeks.     No current facility-administered medications for this visit.   No Known Allergies  Past Medical History:  Diagnosis Date   Iron deficiency anemia 04/07/2017   Lower abdominal pain 12/13/2016   Postprandial diarrhea 12/13/2016   Previous cesarean section 2017   non reassuring FHT   Ulcerative colitis (HCC)    Weight loss 12/13/2016   Past Surgical History:  Procedure Laterality Date    CESAREAN SECTION N/A 01/06/2016   Procedure: CESAREAN SECTION;  Surgeon: Vena Austria, MD;  Location: [facility] ORS;  Service: Obstetrics;  Laterality: N/A;   COLONOSCOPY WITH PROPOFOL N/A 12/27/2016   Procedure: COLONOSCOPY WITH PROPOFOL;  Surgeon: Wyline Mood, MD;  Location: [facility] ENDOSCOPY;  Service: Gastroenterology;  Laterality: N/A;   COLONOSCOPY WITH PROPOFOL N/A 03/27/2019   Procedure: COLONOSCOPY WITH PROPOFOL;  Surgeon: Toney Reil, MD;  Location: [facility] ENDOSCOPY;  Service: Gastroenterology;  Laterality: N/A;   COLONOSCOPY WITH PROPOFOL N/A 03/30/2020   Procedure: COLONOSCOPY WITH PROPOFOL;  Surgeon: Toney Reil, MD;  Location: [facility] ENDOSCOPY;  Service: Gastroenterology;  Laterality: N/A;   ESOPHAGOGASTRODUODENOSCOPY (EGD) WITH PROPOFOL N/A 12/27/2016   Procedure: ESOPHAGOGASTRODUODENOSCOPY (EGD) WITH PROPOFOL;  Surgeon: Wyline Mood, MD;  Location: [facility] ENDOSCOPY;  Service: Gastroenterology;  Laterality: N/A;    Review Of Systems  Constitutional: Denied constitutional symptoms, night sweats, recent illness, fatigue, fever, insomnia and weight loss.  Eyes: Denied eye symptoms, eye pain, photophobia, vision change and visual disturbance.  Ears/Nose/Throat/Neck: Denied ear, nose, throat or neck symptoms, hearing loss, nasal discharge, sinus congestion and sore throat.  Cardiovascular: Denied cardiovascular symptoms, arrhythmia, chest pain/pressure, edema, exercise intolerance, orthopnea and palpitations.  Respiratory: Denied pulmonary symptoms, asthma, pleuritic pain, productive sputum, cough, dyspnea and wheezing.  Gastrointestinal: Hemorrhoids  Genitourinary: Denied genitourinary symptoms including symptomatic vaginal discharge, pelvic relaxation issues, and urinary complaints.  Musculoskeletal: Denied musculoskeletal symptoms, stiffness, swelling, muscle weakness and myalgia.  Dermatologic: Denied dermatology symptoms, rash and scar.  Neurologic: Denied neurology symptoms,  dizziness, headache, neck pain and syncope.  Psychiatric: Denied psychiatric symptoms, anxiety and depression.  Endocrine: Denied endocrine symptoms including hot flashes and night sweats.      Objective:    BP 113/76   Pulse 89   Temp 99.2 F (37.3 C) (Oral)   Wt 143 lb (64.9 kg)   LMP 02/06/2023 (Exact Date)   BMI 24.55 kg/m   Constitutional: Well-developed, well-nourished female in no acute distress Neurological: Alert and oriented to person, place, and time Psychiatric: Mood and affect appropriate Skin: No rashes or lesions Neck: Supple without masses. Trachea is midline.Thyroid is normal size without masses Lymphatics: No cervical, axillary, supraclavicular, or inguinal adenopathy noted Respiratory: Clear to auscultation bilaterally. Good air movement with normal work of breathing. Cardiovascular: Regular rate and rhythm. Extremities grossly normal, nontender with no edema; pulses regular Gastrointestinal: Soft, nontender, nondistended. No masses or hernias appreciated. No hepatosplenomegaly. No fluid wave. No rebound or guarding. Breast Exam: normal appearance, no masses or tenderness, Inspection negative, No nipple retraction or dimpling, No nipple discharge or bleeding, No axillary or supraclavicular adenopathy, Normal to palpation without dominant masses Genitourinary:         External Genitalia: Normal female genitalia    Vagina: Normal  mucosa, no lesions.    Cervix: No lesions, normal size and consistency; no cervical motion tenderness; non-friable; Pap not obtained.    Uterus: Normal size and contour; smooth, mobile, NT. Adnexae: Non-palpable and non-tender Perineum/Anus: External, non-thrombosed hemorrhoids Rectal: deferred    Assessment/Plan:    Christina Obrien is a 43 y.o. female [phone] with normal well-woman gynecologic exam. We discussed preventative tx for her asx hemorrhoids today.   -Screenings:  Pap: due 03/2024 Mammogram: ordered Labs: A1C, Lipid,  STI GAD/PHQ-2 = 0 -Contraception: condoms -Vaccines: declines Flu, Gardasil unsure -Healthy lifestyle modifications discussed: multivitamin, diet, exercise, sunscreen, tobacco and alcohol use. Emphasized importance of regular physical activity.  -Calcium and Vit D recommendation reviewed.  -All questions answered to patient's satisfaction.  -RTC 1 yr for annual, sooner prn.   Return in about 1 year (around 02/21/2024) for Annual.    Julieanne Manson, DO [facility]

## 2023-02-20 ENCOUNTER — Inpatient Hospital Stay: Payer: BC Managed Care – PPO

## 2023-02-20 VITALS — BP 111/75 | HR 80 | Temp 97.5°F | Resp 16

## 2023-02-20 DIAGNOSIS — D509 Iron deficiency anemia, unspecified: Secondary | ICD-10-CM

## 2023-02-20 MED ORDER — IRON SUCROSE 20 MG/ML IV SOLN
200.0000 mg | INTRAVENOUS | Status: DC
Start: 2023-02-20 — End: 2023-02-20
  Administered 2023-02-20: 200 mg via INTRAVENOUS
  Filled 2023-02-20: qty 10

## 2023-02-20 MED ORDER — SODIUM CHLORIDE 0.9% FLUSH
10.0000 mL | Freq: Once | INTRAVENOUS | Status: AC | PRN
  Administered 2023-02-20: 10 mL
  Filled 2023-02-20: qty 10

## 2023-02-21 ENCOUNTER — Other Ambulatory Visit (HOSPITAL_COMMUNITY)
Admission: RE | Admit: 2023-02-21 | Discharge: 2023-02-21 | Disposition: A | Discharge status: Home / Self Care | Payer: BC Managed Care – PPO | Source: Ambulatory Visit | Attending: Obstetrics | Admitting: Obstetrics

## 2023-02-21 ENCOUNTER — Ambulatory Visit: Payer: BC Managed Care – PPO | Admitting: Obstetrics

## 2023-02-21 ENCOUNTER — Encounter: Payer: Self-pay | Admitting: Obstetrics

## 2023-02-21 VITALS — BP 113/76 | HR 89 | Temp 99.2°F | Wt 143.0 lb

## 2023-02-21 DIAGNOSIS — Z01419 Encounter for gynecological examination (general) (routine) without abnormal findings: Secondary | ICD-10-CM | POA: Diagnosis not present

## 2023-02-21 DIAGNOSIS — Z113 Encounter for screening for infections with a predominantly sexual mode of transmission: Secondary | ICD-10-CM

## 2023-02-21 DIAGNOSIS — N760 Acute vaginitis: Secondary | ICD-10-CM | POA: Insufficient documentation

## 2023-02-21 DIAGNOSIS — B9689 Other specified bacterial agents as the cause of diseases classified elsewhere: Secondary | ICD-10-CM | POA: Diagnosis not present

## 2023-02-21 DIAGNOSIS — Z7689 Persons encountering health services in other specified circumstances: Secondary | ICD-10-CM

## 2023-02-21 DIAGNOSIS — Z1231 Encounter for screening mammogram for malignant neoplasm of breast: Secondary | ICD-10-CM | POA: Diagnosis not present

## 2023-02-22 LAB — HEP, RPR, HIV PANEL
HIV Screen 4th Generation wRfx: NONREACTIVE
Hepatitis B Surface Ag: NEGATIVE
RPR Ser Ql: NONREACTIVE

## 2023-02-22 LAB — LIPID PANEL
Chol/HDL Ratio: 2.7 {ratio} (ref 0.0–4.4)
Cholesterol, Total: 175 mg/dL (ref 100–199)
HDL: 65 mg/dL (ref >39)
LDL Chol Calc (NIH): 99 mg/dL (ref 0–99)
Triglycerides: 58 mg/dL (ref 0–149)
VLDL Cholesterol Cal: 11 mg/dL (ref 5–40)

## 2023-02-22 LAB — HEMOGLOBIN A1C
Est. average glucose Bld gHb Est-mCnc: 105 mg/dL
Hgb A1c MFr Bld: 5.3 % (ref 4.8–5.6)

## 2023-02-23 LAB — CERVICOVAGINAL ANCILLARY ONLY
Bacterial Vaginitis (gardnerella): POSITIVE — AB
Chlamydia: NEGATIVE
Comment: NEGATIVE
Comment: NEGATIVE
Comment: NEGATIVE
Comment: NORMAL
Neisseria Gonorrhea: NEGATIVE
Trichomonas: NEGATIVE

## 2023-02-24 ENCOUNTER — Other Ambulatory Visit: Payer: Self-pay

## 2023-02-24 DIAGNOSIS — N760 Acute vaginitis: Secondary | ICD-10-CM

## 2023-02-24 MED ORDER — METRONIDAZOLE 500 MG PO TABS: 500.0000 mg | ORAL_TABLET | Freq: Two times a day (BID) | ORAL | 0 refills | Status: DC

## 2023-02-27 ENCOUNTER — Inpatient Hospital Stay: Payer: BC Managed Care – PPO | Attending: Oncology

## 2023-02-27 VITALS — BP 103/67 | HR 62 | Temp 97.8°F | Resp 18

## 2023-02-27 DIAGNOSIS — D509 Iron deficiency anemia, unspecified: Secondary | ICD-10-CM | POA: Diagnosis not present

## 2023-02-27 MED ORDER — IRON SUCROSE 20 MG/ML IV SOLN
200.0000 mg | INTRAVENOUS | Status: DC
Start: 2023-02-27 — End: 2023-02-27
  Administered 2023-02-27: 200 mg via INTRAVENOUS
  Filled 2023-02-27: qty 10

## 2023-02-27 MED ORDER — SODIUM CHLORIDE 0.9% FLUSH
10.0000 mL | Freq: Once | INTRAVENOUS | Status: AC | PRN
  Administered 2023-02-27: 10 mL
  Filled 2023-02-27: qty 10

## 2023-03-06 ENCOUNTER — Inpatient Hospital Stay: Payer: BC Managed Care – PPO

## 2023-03-06 VITALS — BP 112/57 | HR 73 | Resp 16

## 2023-03-06 DIAGNOSIS — D509 Iron deficiency anemia, unspecified: Secondary | ICD-10-CM

## 2023-03-06 MED ORDER — IRON SUCROSE 20 MG/ML IV SOLN
200.0000 mg | INTRAVENOUS | Status: DC
  Administered 2023-03-06: 200 mg via INTRAVENOUS
  Filled 2023-03-06: qty 10

## 2023-03-13 ENCOUNTER — Inpatient Hospital Stay: Payer: BC Managed Care – PPO

## 2023-03-13 VITALS — BP 114/75 | HR 78 | Temp 98.0°F | Resp 17

## 2023-03-13 DIAGNOSIS — D509 Iron deficiency anemia, unspecified: Secondary | ICD-10-CM | POA: Diagnosis not present

## 2023-03-13 DIAGNOSIS — K5100 Ulcerative (chronic) pancolitis without complications: Secondary | ICD-10-CM | POA: Diagnosis not present

## 2023-03-13 MED ORDER — SODIUM CHLORIDE 0.9% FLUSH
10.0000 mL | Freq: Once | INTRAVENOUS | Status: AC | PRN
  Administered 2023-03-13: 10 mL
  Filled 2023-03-13: qty 10

## 2023-03-13 MED ORDER — IRON SUCROSE 20 MG/ML IV SOLN
200.0000 mg | INTRAVENOUS | Status: DC
  Administered 2023-03-13: 200 mg via INTRAVENOUS

## 2023-03-20 DIAGNOSIS — K5100 Ulcerative (chronic) pancolitis without complications: Secondary | ICD-10-CM | POA: Diagnosis not present

## 2023-04-14 ENCOUNTER — Other Ambulatory Visit: Payer: BC Managed Care – PPO

## 2023-04-17 ENCOUNTER — Inpatient Hospital Stay: Payer: BC Managed Care – PPO | Attending: Oncology

## 2023-04-17 DIAGNOSIS — D509 Iron deficiency anemia, unspecified: Secondary | ICD-10-CM | POA: Insufficient documentation

## 2023-04-17 LAB — CBC (CANCER CENTER ONLY)
HCT: 33.3 % — ABNORMAL LOW (ref 36.0–46.0)
Hemoglobin: 11.1 g/dL — ABNORMAL LOW (ref 12.0–15.0)
MCH: 29 pg (ref 26.0–34.0)
MCHC: 33.3 g/dL (ref 30.0–36.0)
MCV: 86.9 fL (ref 80.0–100.0)
Platelet Count: 190 10*3/uL (ref 150–400)
RBC: 3.83 MIL/uL — ABNORMAL LOW (ref 3.87–5.11)
RDW: 16.8 % — ABNORMAL HIGH (ref 11.5–15.5)
WBC Count: 7.9 10*3/uL (ref 4.0–10.5)
nRBC: 0 % (ref 0.0–0.2)

## 2023-04-17 LAB — IRON AND TIBC
Iron: 64 ug/dL (ref 28–170)
Saturation Ratios: 23 % (ref 10.4–31.8)
TIBC: 273 ug/dL (ref 250–450)
UIBC: 209 ug/dL

## 2023-04-17 LAB — FERRITIN: Ferritin: 33 ng/mL (ref 11–307)

## 2023-05-08 DIAGNOSIS — K5100 Ulcerative (chronic) pancolitis without complications: Secondary | ICD-10-CM | POA: Diagnosis not present

## 2023-05-17 DIAGNOSIS — K5100 Ulcerative (chronic) pancolitis without complications: Secondary | ICD-10-CM | POA: Diagnosis not present

## 2023-06-12 ENCOUNTER — Inpatient Hospital Stay: Payer: BC Managed Care – PPO | Admitting: Oncology

## 2023-06-12 ENCOUNTER — Inpatient Hospital Stay: Payer: BC Managed Care – PPO | Attending: Oncology

## 2023-06-12 ENCOUNTER — Encounter: Payer: Self-pay | Admitting: Oncology

## 2023-06-14 ENCOUNTER — Other Ambulatory Visit: Payer: BC Managed Care – PPO

## 2023-06-14 ENCOUNTER — Ambulatory Visit: Payer: BC Managed Care – PPO | Admitting: Oncology

## 2023-07-03 DIAGNOSIS — K5100 Ulcerative (chronic) pancolitis without complications: Secondary | ICD-10-CM | POA: Diagnosis not present

## 2023-07-10 DIAGNOSIS — K5100 Ulcerative (chronic) pancolitis without complications: Secondary | ICD-10-CM | POA: Diagnosis not present

## 2023-07-27 DIAGNOSIS — L853 Xerosis cutis: Secondary | ICD-10-CM | POA: Diagnosis not present

## 2023-07-27 DIAGNOSIS — B353 Tinea pedis: Secondary | ICD-10-CM | POA: Diagnosis not present

## 2023-08-28 ENCOUNTER — Ambulatory Visit
Admission: RE | Admit: 2023-08-28 | Discharge: 2023-08-28 | Disposition: A | Discharge status: Home / Self Care | Source: Ambulatory Visit | Attending: Obstetrics | Admitting: Obstetrics

## 2023-08-28 DIAGNOSIS — Z113 Encounter for screening for infections with a predominantly sexual mode of transmission: Secondary | ICD-10-CM

## 2023-08-28 DIAGNOSIS — Z7689 Persons encountering health services in other specified circumstances: Secondary | ICD-10-CM | POA: Diagnosis not present

## 2023-08-28 DIAGNOSIS — Z1231 Encounter for screening mammogram for malignant neoplasm of breast: Secondary | ICD-10-CM | POA: Insufficient documentation

## 2023-08-31 ENCOUNTER — Other Ambulatory Visit: Payer: Self-pay | Admitting: Obstetrics

## 2023-08-31 DIAGNOSIS — R928 Other abnormal and inconclusive findings on diagnostic imaging of breast: Secondary | ICD-10-CM

## 2023-09-06 ENCOUNTER — Ambulatory Visit
Admission: RE | Admit: 2023-09-06 | Discharge: 2023-09-06 | Disposition: A | Discharge status: Home / Self Care | Source: Ambulatory Visit | Attending: Obstetrics | Admitting: Obstetrics

## 2023-09-06 DIAGNOSIS — R928 Other abnormal and inconclusive findings on diagnostic imaging of breast: Secondary | ICD-10-CM | POA: Diagnosis not present

## 2023-09-06 DIAGNOSIS — N6310 Unspecified lump in the right breast, unspecified quadrant: Secondary | ICD-10-CM | POA: Diagnosis not present

## 2023-09-06 DIAGNOSIS — R92323 Mammographic fibroglandular density, bilateral breasts: Secondary | ICD-10-CM | POA: Diagnosis not present

## 2023-09-08 ENCOUNTER — Other Ambulatory Visit: Payer: Self-pay | Admitting: Obstetrics

## 2023-09-08 ENCOUNTER — Ambulatory Visit: Payer: Self-pay | Admitting: Obstetrics

## 2023-09-08 DIAGNOSIS — R928 Other abnormal and inconclusive findings on diagnostic imaging of breast: Secondary | ICD-10-CM

## 2023-09-12 DIAGNOSIS — K5100 Ulcerative (chronic) pancolitis without complications: Secondary | ICD-10-CM | POA: Diagnosis not present

## 2023-10-19 DIAGNOSIS — R21 Rash and other nonspecific skin eruption: Secondary | ICD-10-CM | POA: Diagnosis not present

## 2023-10-19 DIAGNOSIS — Z862 Personal history of diseases of the blood and blood-forming organs and certain disorders involving the immune mechanism: Secondary | ICD-10-CM | POA: Diagnosis not present

## 2023-10-19 DIAGNOSIS — Z1211 Encounter for screening for malignant neoplasm of colon: Secondary | ICD-10-CM | POA: Diagnosis not present

## 2023-10-19 DIAGNOSIS — K5100 Ulcerative (chronic) pancolitis without complications: Secondary | ICD-10-CM | POA: Diagnosis not present

## 2023-10-23 DIAGNOSIS — B354 Tinea corporis: Secondary | ICD-10-CM | POA: Diagnosis not present

## 2023-10-23 DIAGNOSIS — R21 Rash and other nonspecific skin eruption: Secondary | ICD-10-CM | POA: Diagnosis not present

## 2023-10-23 DIAGNOSIS — L739 Follicular disorder, unspecified: Secondary | ICD-10-CM | POA: Diagnosis not present

## 2023-10-23 DIAGNOSIS — L738 Other specified follicular disorders: Secondary | ICD-10-CM | POA: Diagnosis not present

## 2023-11-01 ENCOUNTER — Encounter: Payer: Self-pay | Admitting: Gastroenterology

## 2023-11-01 ENCOUNTER — Ambulatory Visit: Admitting: Certified Registered Nurse Anesthetist

## 2023-11-01 ENCOUNTER — Encounter
Admission: RE | Disposition: A | Discharge status: Home / Self Care | Payer: Self-pay | Source: Home / Self Care | Attending: Gastroenterology

## 2023-11-01 ENCOUNTER — Other Ambulatory Visit: Payer: Self-pay

## 2023-11-01 ENCOUNTER — Ambulatory Visit
Admission: RE | Admit: 2023-11-01 | Discharge: 2023-11-01 | Disposition: A | Discharge status: Home / Self Care | Attending: Gastroenterology | Admitting: Gastroenterology

## 2023-11-01 DIAGNOSIS — Z1211 Encounter for screening for malignant neoplasm of colon: Secondary | ICD-10-CM | POA: Insufficient documentation

## 2023-11-01 DIAGNOSIS — K644 Residual hemorrhoidal skin tags: Secondary | ICD-10-CM | POA: Diagnosis not present

## 2023-11-01 DIAGNOSIS — Z87891 Personal history of nicotine dependence: Secondary | ICD-10-CM | POA: Diagnosis not present

## 2023-11-01 DIAGNOSIS — D509 Iron deficiency anemia, unspecified: Secondary | ICD-10-CM | POA: Diagnosis not present

## 2023-11-01 SURGERY — COLONOSCOPY
Anesthesia: General

## 2023-11-01 MED ORDER — PROPOFOL 500 MG/50ML IV EMUL
INTRAVENOUS | Status: DC | PRN
  Administered 2023-11-01: 130 ug/kg/min via INTRAVENOUS

## 2023-11-01 MED ORDER — PROPOFOL 10 MG/ML IV BOLUS
INTRAVENOUS | Status: DC | PRN
  Administered 2023-11-01: 70 mg via INTRAVENOUS
  Administered 2023-11-01: 30 mg via INTRAVENOUS

## 2023-11-01 MED ORDER — SODIUM CHLORIDE 0.9 % IV SOLN: INTRAVENOUS | Status: DC

## 2023-11-01 MED ORDER — LIDOCAINE HCL (CARDIAC) PF 100 MG/5ML IV SOSY
PREFILLED_SYRINGE | INTRAVENOUS | Status: DC | PRN
  Administered 2023-11-01: 80 mg via INTRAVENOUS

## 2023-11-01 NOTE — H&P (Signed)
 Christina JONELLE Brooklyn, MD [facility], DHIP [address]  [place], [place] [address]  Main: [phone] Fax:  [phone] Pager: [phone]   Primary Care Physician:  Pcp, No Primary Gastroenterologist:  Dr. Corinn JONELLE Obrien  Pre-Procedure History & Physical: HPI:  Christina Obrien is a 43 y.o. female is here for an colonoscopy.   Past Medical History:  Diagnosis Date   Iron  deficiency anemia 04/07/2017   Lower abdominal pain 12/13/2016   Postprandial diarrhea 12/13/2016   Previous cesarean section 2017   non reassuring FHT   Ulcerative colitis (HCC)    Weight loss 12/13/2016    Past Surgical History:  Procedure Laterality Date   CESAREAN SECTION N/A 01/06/2016   Procedure: CESAREAN SECTION;  Surgeon: [name], MD;  Location: [facility] ORS;  Service: Obstetrics;  Laterality: N/A;   COLONOSCOPY WITH PROPOFOL  N/A 12/27/2016   Procedure: COLONOSCOPY WITH PROPOFOL ;  Surgeon: [name], MD;  Location: [facility] ENDOSCOPY;  Service: Gastroenterology;  Laterality: N/A;   COLONOSCOPY WITH PROPOFOL  N/A 03/27/2019   Procedure: COLONOSCOPY WITH PROPOFOL ;  Surgeon: Obrien Christina Skiff, MD;  Location: [facility] ENDOSCOPY;  Service: Gastroenterology;  Laterality: N/A;   COLONOSCOPY WITH PROPOFOL  N/A 03/30/2020   Procedure: COLONOSCOPY WITH PROPOFOL ;  Surgeon: Obrien Christina Skiff, MD;  Location: [facility] ENDOSCOPY;  Service: Gastroenterology;  Laterality: N/A;   ESOPHAGOGASTRODUODENOSCOPY (EGD) WITH PROPOFOL  N/A 12/27/2016   Procedure: ESOPHAGOGASTRODUODENOSCOPY (EGD) WITH PROPOFOL ;  Surgeon: [name], MD;  Location: [facility] ENDOSCOPY;  Service: Gastroenterology;  Laterality: N/A;    Prior to Admission medications   Medication Sig Start Date End Date Taking? Authorizing Provider  inFLIXimab -dyyb (INFLECTRA ) 100 MG SOLR Inject 100 mg into the vein every 8 (eight) weeks.    [provider]  metroNIDAZOLE  (FLAGYL ) 500 MG tablet Take 1 tablet (500 mg total) by mouth 2  (two) times daily. Patient not taking: Reported on 11/01/2023 02/24/23   Leigh Sober, MD    Allergies as of 10/21/2023   (No Known Allergies)    Family History  Problem Relation Age of Onset   Diabetes Maternal Grandmother    Liver cancer Maternal Grandfather    Colitis Paternal Grandmother    Stroke Neg Hx    Breast cancer Neg Hx    Ovarian cancer Neg Hx     Social History   Socioeconomic History   Marital status: Single    Spouse name: Not on file   Number of children: 2   Years of education: Not on file   Highest education level: Not on file  Occupational History   Not on file  Tobacco Use   Smoking status: Former    Current packs/day: 0.00    Types: Cigarettes    Quit date: 11/24/2016    Years since quitting: 6.9   Smokeless tobacco: Never   Tobacco comments:    has not smoked in 1-2 weeks   smokes marijuana   Vaping Use   Vaping status: Never Used  Substance and Sexual Activity   Alcohol use: Yes    Alcohol/week: 2.0 standard drinks of alcohol    Types: 2 Glasses of wine per week    Comment: occasional glass of wine   Drug use: Never   Sexual activity: Yes    Partners: Male    Birth control/protection: None, Condom    Comment: occasional condoms  Other Topics Concern   Not on file  Social History Narrative   Not on file   Social Drivers of Health  Financial Resource Strain: Low Risk  (10/19/2023)   Received from [facility]   Overall Financial Resource Strain (CARDIA)    Difficulty of Paying Living Expenses: Not very hard  Food Insecurity: No Food Insecurity (10/19/2023)   Received from [facility]   Hunger Vital Sign    Within the past 12 months, you worried that your food would run out before you got the money to buy more.: Never true    Within the past 12 months, the food you bought just didn't last and you didn't have money to get more.: Never true  Transportation Needs: No Transportation Needs (10/19/2023)    Received from [facility] - Transportation    In the past 12 months, has lack of transportation kept you from medical appointments or from getting medications?: No    Lack of Transportation (Non-Medical): No  Physical Activity: Inactive (12/13/2016)   Exercise Vital Sign    Days of Exercise per Week: 0 days    Minutes of Exercise per Session: 0 min  Stress: No Stress Concern Present (12/13/2016)   [employer] of Occupational Health - Occupational Stress Questionnaire    Feeling of Stress : Only a little  Social Connections: Somewhat Isolated (12/13/2016)   Social Connection and Isolation Panel    Frequency of Communication with Friends and Family: More than three times a week    Frequency of Social Gatherings with Friends and Family: Twice a week    Attends Religious Services: More than 4 times per year    Active Member of [employer] or Organizations: No    Attends Banker Meetings: Never    Marital Status: Never married  Intimate Partner Violence: Not At Risk (02/14/2023)   Humiliation, Afraid, Rape, and Kick questionnaire    Fear of Current or Ex-Partner: No    Emotionally Abused: No    Physically Abused: No    Sexually Abused: No    Review of Systems: See HPI, otherwise negative ROS  Physical Exam: BP (!) 146/83   Pulse 76   Temp (!) 97 F (36.1 C) (Temporal)   Resp 16   Ht 5' 4 (1.626 m)   Wt 61.7 kg   SpO2 100%   BMI 23.34 kg/m  General:   Alert,  pleasant and cooperative in NAD Head:  Normocephalic and atraumatic. Neck:  Supple; no masses or thyromegaly. Lungs:  Clear throughout to auscultation.    Heart:  Regular rate and rhythm. Abdomen:  Soft, nontender and nondistended. Normal bowel sounds, without guarding, and without rebound.   Neurologic:  Alert and  oriented x4;  grossly normal neurologically.  Impression/Plan: [name] is here for an colonoscopy to be performed for colon cancer screening  Risks,  benefits, limitations, and alternatives regarding  colonoscopy have been reviewed with the patient.  Questions have been answered.  All parties agreeable.   Christina Brooklyn, MD  11/01/2023, 10:01 AM

## 2023-11-01 NOTE — Anesthesia Postprocedure Evaluation (Signed)
 Anesthesia Post Note  Patient: Christina Obrien  Procedure(s) Performed: COLONOSCOPY  Patient location during evaluation: PACU Anesthesia Type: General Level of consciousness: awake Pain management: pain level controlled Vital Signs Assessment: post-procedure vital signs reviewed and stable Respiratory status: nonlabored ventilation Cardiovascular status: stable Anesthetic complications: no   No notable events documented.   Last Vitals:  Vitals:   11/01/23 1116 11/01/23 1129  BP: 118/79 117/88  Pulse: 94 74  Resp: 18 11  Temp:    SpO2: 100% 100%    Last Pain:  Vitals:   11/01/23 1129  TempSrc:   PainSc: 0-No pain                 VAN STAVEREN,Elesha Thedford

## 2023-11-01 NOTE — Transfer of Care (Signed)
 Immediate Anesthesia Transfer of Care Note  Patient: Christina Obrien  Procedure(s) Performed: COLONOSCOPY  Patient Location: Endoscopy Unit  Anesthesia Type:General  Level of Consciousness: drowsy  Airway & Oxygen Therapy: Patient Spontanous Breathing  Post-op Assessment: Report given to RN and Post -op Vital signs reviewed and stable  Post vital signs: Reviewed and stable  Last Vitals:  Vitals Value Taken Time  BP    Temp    Pulse    Resp    SpO2      Last Pain:  Vitals:   11/01/23 0940  TempSrc: Temporal  PainSc: 0-No pain         Complications: No notable events documented.

## 2023-11-01 NOTE — Anesthesia Preprocedure Evaluation (Signed)
 Anesthesia Evaluation  Patient identified by MRN, date of birth, ID band Patient awake    Reviewed: Allergy & Precautions, NPO status , Patient's Chart, lab work & pertinent test results  Airway Mallampati: II  TM Distance: >3 FB Neck ROM: full    Dental  (+) Teeth Intact   Pulmonary neg pulmonary ROS, Patient abstained from smoking., former smoker   Pulmonary exam normal        Cardiovascular negative cardio ROS Normal cardiovascular exam Rhythm:Regular Rate:Normal     Neuro/Psych negative neurological ROS  negative psych ROS   GI/Hepatic negative GI ROS, Neg liver ROS, PUD,,,  Endo/Other  negative endocrine ROS    Renal/GU negative Renal ROS  negative genitourinary   Musculoskeletal   Abdominal Normal abdominal exam  (+)   Peds negative pediatric ROS (+)  Hematology negative hematology ROS (+)   Anesthesia Other Findings Past Medical History: 04/07/2017: Iron  deficiency anemia 12/13/2016: Lower abdominal pain 12/13/2016: Postprandial diarrhea 2017: Previous cesarean section     Comment:  non reassuring FHT No date: Ulcerative colitis (HCC) 12/13/2016: Weight loss  Past Surgical History: 01/06/2016: CESAREAN SECTION; N/A     Comment:  Procedure: CESAREAN SECTION;  Surgeon: [name],              MD;  Location: [facility] ORS;  Service: Obstetrics;                Laterality: N/A; 12/27/2016: COLONOSCOPY WITH PROPOFOL ; N/A     Comment:  Procedure: COLONOSCOPY WITH PROPOFOL ;  Surgeon: [name], MD;  Location: [facility] ENDOSCOPY;  Service:               Gastroenterology;  Laterality: N/A; 03/27/2019: COLONOSCOPY WITH PROPOFOL ; N/A     Comment:  Procedure: COLONOSCOPY WITH PROPOFOL ;  Surgeon: Unk Corinn Skiff, MD;  Location: [facility] ENDOSCOPY;  Service:               Gastroenterology;  Laterality: N/A; 03/30/2020: COLONOSCOPY WITH PROPOFOL ; N/A     Comment:  Procedure: COLONOSCOPY  WITH PROPOFOL ;  Surgeon: Unk Corinn Skiff, MD;  Location: [facility] ENDOSCOPY;  Service:               Gastroenterology;  Laterality: N/A; 12/27/2016: ESOPHAGOGASTRODUODENOSCOPY (EGD) WITH PROPOFOL ; N/A     Comment:  Procedure: ESOPHAGOGASTRODUODENOSCOPY (EGD) WITH               PROPOFOL ;  Surgeon: [name], MD;  Location: [facility]               ENDOSCOPY;  Service: Gastroenterology;  Laterality: N/A;  BMI    Body Mass Index: 23.34 kg/m      Reproductive/Obstetrics negative OB ROS                              Anesthesia Physical Anesthesia Plan  ASA: 2  Anesthesia Plan: General   Post-op Pain Management:    Induction: Intravenous  PONV Risk Score and Plan: Propofol  infusion and TIVA  Airway Management Planned: Natural Airway and Nasal Cannula  Additional Equipment:   Intra-op Plan:   Post-operative Plan:   Informed Consent: I have reviewed the patients History and Physical, chart, labs and discussed the procedure  including the risks, benefits and alternatives for the proposed anesthesia with the patient or authorized representative who has indicated his/her understanding and acceptance.     Dental Advisory Given  Plan Discussed with: CRNA  Anesthesia Plan Comments:         Anesthesia Quick Evaluation

## 2023-11-01 NOTE — Op Note (Signed)
 [facility] Gastroenterology Patient Name: Christina Obrien Procedure Date: 11/01/2023 10:36 AM MRN: [id number] Account #: [id number] Date of Birth: [birth year] Admit Type: Outpatient Age: 43 Room: [facility] ENDO ROOM 4 Gender: Female Note Status: Finalized Instrument Name: Peds Colonoscope [id number] Procedure:             Colonoscopy Indications:           Screening for colorectal malignant neoplasm, Last                         colonoscopy: March 2022 Providers:             Corinn Jess Brooklyn MD, MD Referring MD:          No Local Md, MD (Referring MD) Medicines:             General Anesthesia Complications:         No immediate complications. Estimated blood loss: None. Procedure:             Pre-Anesthesia Assessment:                        - Prior to the procedure, a History and Physical was                         performed, and patient medications and allergies were                         reviewed. The patient is competent. The risks and                         benefits of the procedure and the sedation options and                         risks were discussed with the patient. All questions                         were answered and informed consent was obtained.                         Patient identification and proposed procedure were                         verified by the physician, the nurse, the                         anesthesiologist, the anesthetist and the technician                         in the pre-procedure area in the procedure room in the                         endoscopy suite. Mental Status Examination: alert and                         oriented. Airway Examination: normal oropharyngeal                         airway and neck mobility. Respiratory Examination:  clear to auscultation. CV Examination: normal.                         Prophylactic Antibiotics: The patient does not require                         prophylactic  antibiotics. Prior Anticoagulants: The                         patient has taken no anticoagulant or antiplatelet                         agents. ASA Grade Assessment: II - A patient with mild                         systemic disease. After reviewing the risks and                         benefits, the patient was deemed in satisfactory                         condition to undergo the procedure. The anesthesia                         plan was to use general anesthesia. Immediately prior                         to administration of medications, the patient was                         re-assessed for adequacy to receive sedatives. The                         heart rate, respiratory rate, oxygen saturations,                         blood pressure, adequacy of pulmonary ventilation, and                         response to care were monitored throughout the                         procedure. The physical status of the patient was                         re-assessed after the procedure.                        After obtaining informed consent, the colonoscope was                         passed under direct vision. Throughout the procedure,                         the patient's blood pressure, pulse, and oxygen                         saturations were monitored continuously. The  Colonoscope was introduced through the anus and                         advanced to the the cecum, identified by appendiceal                         orifice and ileocecal valve. The colonoscopy was                         unusually difficult due to restricted mobility of the                         colon. Successful completion of the procedure was                         aided by applying abdominal pressure. The patient                         tolerated the procedure well. The quality of the bowel                         preparation was evaluated using the BBPS [facility] Bowel                          Preparation Scale) with scores of: Right Colon = 3,                         Transverse Colon = 3 and Left Colon = 3 (entire mucosa                         seen well with no residual staining, small fragments                         of stool or opaque liquid). The total BBPS score                         equals 9. The ileocecal valve, appendiceal orifice,                         and rectum were photographed. Findings:      Skin tags were found on perianal exam.      The colon (entire examined portion) appeared normal. Tight rectosigmoid       area      The retroflexed view of the distal rectum and anal verge was normal and       showed no anal or rectal abnormalities. Impression:            - Perianal skin tags found on perianal exam.                        - The entire examined colon is normal.                        - The distal rectum and anal verge are normal on                         retroflexion view.                        -  No specimens collected. Recommendation:        - Discharge patient to home (with escort).                        - Resume previous diet today.                        - Continue present medications.                        - Repeat colonoscopy in 5 years for screening purposes.                        - Return to my office as previously scheduled. Procedure Code(s):     --- Professional ---                        H9878, Colorectal cancer screening; colonoscopy on                         individual not meeting criteria for high risk Diagnosis Code(s):     --- Professional ---                        Z12.11, Encounter for screening for malignant neoplasm                         of colon                        K64.4, Residual hemorrhoidal skin tags CPT copyright 2022 [employer]. All rights reserved. The codes documented in this report are preliminary and upon coder review may  be revised to meet current compliance requirements. Dr. Corinn Brooklyn Corinn Jess Brooklyn MD, MD 11/01/2023 11:07:24 AM This report has been signed electronically. Number of Addenda: 0 Note Initiated On: 11/01/2023 10:36 AM Scope Withdrawal Time: 0 hours 10 minutes 12 seconds  Total Procedure Duration: 0 hours 17 minutes 17 seconds  Estimated Blood Loss:  Estimated blood loss: none.      [facility]

## 2023-11-06 DIAGNOSIS — L739 Follicular disorder, unspecified: Secondary | ICD-10-CM | POA: Diagnosis not present

## 2023-11-06 DIAGNOSIS — B354 Tinea corporis: Secondary | ICD-10-CM | POA: Diagnosis not present

## 2023-11-06 DIAGNOSIS — R21 Rash and other nonspecific skin eruption: Secondary | ICD-10-CM | POA: Diagnosis not present

## 2023-11-06 DIAGNOSIS — L738 Other specified follicular disorders: Secondary | ICD-10-CM | POA: Diagnosis not present

## 2023-11-06 DIAGNOSIS — K5100 Ulcerative (chronic) pancolitis without complications: Secondary | ICD-10-CM | POA: Diagnosis not present

## 2023-11-07 DIAGNOSIS — K5100 Ulcerative (chronic) pancolitis without complications: Secondary | ICD-10-CM | POA: Diagnosis not present

## 2023-11-27 DIAGNOSIS — L02422 Furuncle of left axilla: Secondary | ICD-10-CM | POA: Diagnosis not present

## 2023-11-27 DIAGNOSIS — R21 Rash and other nonspecific skin eruption: Secondary | ICD-10-CM | POA: Diagnosis not present

## 2023-11-27 DIAGNOSIS — B354 Tinea corporis: Secondary | ICD-10-CM | POA: Diagnosis not present

## 2023-11-27 DIAGNOSIS — L089 Local infection of the skin and subcutaneous tissue, unspecified: Secondary | ICD-10-CM | POA: Diagnosis not present

## 2023-12-04 DIAGNOSIS — Z862 Personal history of diseases of the blood and blood-forming organs and certain disorders involving the immune mechanism: Secondary | ICD-10-CM | POA: Diagnosis not present

## 2023-12-04 DIAGNOSIS — Z09 Encounter for follow-up examination after completed treatment for conditions other than malignant neoplasm: Secondary | ICD-10-CM | POA: Diagnosis not present

## 2023-12-07 ENCOUNTER — Other Ambulatory Visit: Payer: Self-pay | Admitting: Obstetrics

## 2023-12-07 DIAGNOSIS — R928 Other abnormal and inconclusive findings on diagnostic imaging of breast: Secondary | ICD-10-CM

## 2023-12-11 DIAGNOSIS — E538 Deficiency of other specified B group vitamins: Secondary | ICD-10-CM | POA: Diagnosis not present

## 2023-12-11 DIAGNOSIS — Z862 Personal history of diseases of the blood and blood-forming organs and certain disorders involving the immune mechanism: Secondary | ICD-10-CM | POA: Diagnosis not present

## 2023-12-25 DIAGNOSIS — L0292 Furuncle, unspecified: Secondary | ICD-10-CM | POA: Diagnosis not present

## 2023-12-25 DIAGNOSIS — L2084 Intrinsic (allergic) eczema: Secondary | ICD-10-CM | POA: Diagnosis not present

## 2024-01-01 DIAGNOSIS — K5100 Ulcerative (chronic) pancolitis without complications: Secondary | ICD-10-CM | POA: Diagnosis not present

## 2024-01-02 DIAGNOSIS — K5100 Ulcerative (chronic) pancolitis without complications: Secondary | ICD-10-CM | POA: Diagnosis not present

## 2024-03-11 ENCOUNTER — Other Ambulatory Visit

## 2024-03-11 ENCOUNTER — Encounter
